# Patient Record
Sex: Male | Born: 1990 | Race: White | Hispanic: No | Marital: Married | Smoking: Never smoker
Health system: Southern US, Community
[De-identification: ages and names within clinical notes are randomized; demographics above are authoritative.]

## PROBLEM LIST (undated history)

## (undated) DIAGNOSIS — L301 Dyshidrosis [pompholyx]: Secondary | ICD-10-CM

## (undated) HISTORY — DX: Dyshidrosis (pompholyx): L30.1

## (undated) HISTORY — PX: WISDOM TOOTH EXTRACTION: SHX21

---

## 2015-08-06 ENCOUNTER — Ambulatory Visit (INDEPENDENT_AMBULATORY_CARE_PROVIDER_SITE_OTHER): Payer: 59 | Admitting: Primary Care

## 2015-08-06 ENCOUNTER — Encounter: Payer: Self-pay | Admitting: Primary Care

## 2015-08-06 VITALS — BP 122/82 | HR 81 | Temp 98.2°F | Ht 68.5 in | Wt 157.8 lb

## 2015-08-06 DIAGNOSIS — Z23 Encounter for immunization: Secondary | ICD-10-CM | POA: Diagnosis not present

## 2015-08-06 DIAGNOSIS — Z0000 Encounter for general adult medical examination without abnormal findings: Secondary | ICD-10-CM | POA: Diagnosis not present

## 2015-08-06 LAB — COMPREHENSIVE METABOLIC PANEL
ALK PHOS: 35 U/L — AB (ref 39–117)
ALT: 17 U/L (ref 0–53)
AST: 20 U/L (ref 0–37)
Albumin: 4.5 g/dL (ref 3.5–5.2)
BUN: 13 mg/dL (ref 6–23)
CO2: 30 meq/L (ref 19–32)
Calcium: 9.7 mg/dL (ref 8.4–10.5)
Chloride: 103 mEq/L (ref 96–112)
Creatinine, Ser: 1 mg/dL (ref 0.40–1.50)
GFR: 96.34 mL/min (ref >60.00)
GLUCOSE: 102 mg/dL — AB (ref 70–99)
POTASSIUM: 4.2 meq/L (ref 3.5–5.1)
Sodium: 138 mEq/L (ref 135–145)
TOTAL PROTEIN: 7.3 g/dL (ref 6.0–8.3)
Total Bilirubin: 0.4 mg/dL (ref 0.2–1.2)

## 2015-08-06 LAB — LIPID PANEL
CHOLESTEROL: 195 mg/dL (ref 0–200)
HDL: 53.9 mg/dL (ref >39.00)
LDL CALC: 110 mg/dL — AB (ref 0–99)
NonHDL: 141.17
TRIGLYCERIDES: 155 mg/dL — AB (ref 0.0–149.0)
Total CHOL/HDL Ratio: 4
VLDL: 31 mg/dL (ref 0.0–40.0)

## 2015-08-06 NOTE — Patient Instructions (Signed)
You've been provided with a tetanus vaccination which will cover you for 10 years.  Continue your healthy lifestyle.   Ensure you are drinking 64 ounces of water daily.  Increase your exercise regimen. You should be getting 1 hour of moderate intensity exercise 5 days weekly.  Schedule a dental examination.  Complete lab work prior to leaving today. I will notify you of your results once received.   Follow up in 1 year for repeat physical or sooner if needed.  It was a pleasure to meet you today! Please don't hesitate to call me with any questions. Welcome to [facility]!

## 2015-08-06 NOTE — Assessment & Plan Note (Signed)
Tdap due and provided today. Leads an active and healthy lifestyle. Discussed to increase consumption of water and exercise. Exam unremarkable. Labs pending. Follow up in 1 year for repeat physical.

## 2015-08-06 NOTE — Progress Notes (Signed)
Pre visit review using our clinic review tool, if applicable. No additional management support is needed unless otherwise documented below in the visit note. 

## 2015-08-06 NOTE — Progress Notes (Signed)
Subjective:    Patient ID: Oscar Armstrong, male    DOB: [birth year], 25 y.o.   MRN: [id number]  HPI  Oscar Armstrong is a 25 year old male who presents today to establish care and for complete physical.  Immunizations: -Tetanus: Unsure, [facility] reports 2003. -Influenza: Received last season.   Diet: Endorses a healthy diet. 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                    Review of Systems  Constitutional: Negative for unexpected weight change.  HENT: Negative for rhinorrhea.   Respiratory: Negative for shortness of breath.   Cardiovascular: Negative for chest pain.  Gastrointestinal: Negative for diarrhea and constipation.  Genitourinary: Negative for difficulty urinating.  Musculoskeletal: Negative for myalgias and arthralgias.  Skin: Negative for rash.  Allergic/Immunologic: Positive for environmental allergies.  Neurological: Negative for dizziness, numbness and headaches.  Psychiatric/Behavioral:       Denies concerns for anxiety or depression       No past medical history on file.   Social History   Social History  . Marital Status: Married    Spouse Name: N/A  . Number of Children: N/A  . Years of Education: N/A   Occupational History  . Not on file.   Social History Main Topics  . Smoking status: Never Smoker   . Smokeless tobacco: Not on file  . Alcohol Use: No  . Drug Use: No  . Sexual Activity: Not on file   Other Topics Concern  . Not on file   Social History Narrative   Married.   1 son.   Works as a Community education officer.   Enjoys Presenter, broadcasting games, spending time with family, church.           Past  Surgical History  Procedure Laterality Date  . Wisdom tooth extraction      Family History  Problem Relation Age of Onset  . Arthritis Mother   . Hyperlipidemia Mother   . Hyperlipidemia Maternal Aunt   . Hyperlipidemia Maternal Grandmother   . Hypertension Mother   . Kennedy's disease Maternal Grandfather     No Known Allergies  No current outpatient prescriptions on file prior to visit.   No current facility-administered medications on file prior to visit.    BP 122/82 mmHg  Pulse 81  Temp(Src) 98.2 F (36.8 C) (Oral)  Ht 5' 8.5" (1.74 m)  Wt 157 lb 12.8 oz (71.578 kg)  BMI 23.64 kg/m2  SpO2 98%    Objective:   Physical Exam  Constitutional: He is oriented to person, place, and time. He appears well-nourished.  HENT:  Right Ear: Tympanic membrane and ear canal normal.  Left Ear: Tympanic membrane and ear canal normal.  Nose: Nose normal. Right sinus exhibits no maxillary sinus tenderness and no frontal sinus tenderness. Left sinus exhibits no maxillary sinus tenderness and no frontal sinus tenderness.  Mouth/Throat: Oropharynx is clear and moist.  Eyes: Conjunctivae and EOM are normal. Pupils are equal, round, and reactive to light.  Neck: Neck supple. Carotid bruit is not present. No thyromegaly present.  Cardiovascular: Normal rate, regular rhythm and normal heart sounds.   Pulmonary/Chest: Effort normal and breath  sounds normal. He has no wheezes. He has no rales.  Abdominal: Soft. Bowel sounds are normal. There is no tenderness.  Musculoskeletal: Normal range of motion.  Neurological: He is alert and oriented to person, place, and time. He has normal reflexes. No cranial nerve deficit.  Skin: Skin is warm and dry.  Psychiatric: He has a normal mood and affect.          Assessment & Plan:

## 2015-08-07 ENCOUNTER — Encounter: Payer: Self-pay | Admitting: *Deleted

## 2016-08-05 DIAGNOSIS — H52223 Regular astigmatism, bilateral: Secondary | ICD-10-CM | POA: Diagnosis not present

## 2016-08-05 DIAGNOSIS — H5213 Myopia, bilateral: Secondary | ICD-10-CM | POA: Diagnosis not present

## 2016-08-05 DIAGNOSIS — H50812 Duane's syndrome, left eye: Secondary | ICD-10-CM | POA: Diagnosis not present

## 2016-08-11 ENCOUNTER — Ambulatory Visit (INDEPENDENT_AMBULATORY_CARE_PROVIDER_SITE_OTHER): Payer: 59 | Admitting: Primary Care

## 2016-08-11 ENCOUNTER — Ambulatory Visit (INDEPENDENT_AMBULATORY_CARE_PROVIDER_SITE_OTHER)
Admission: RE | Admit: 2016-08-11 | Discharge: 2016-08-11 | Disposition: A | Discharge status: Home / Self Care | Payer: 59 | Source: Ambulatory Visit | Attending: Primary Care | Admitting: Primary Care

## 2016-08-11 ENCOUNTER — Telehealth: Payer: Self-pay

## 2016-08-11 ENCOUNTER — Encounter: Payer: Self-pay | Admitting: Primary Care

## 2016-08-11 VITALS — BP 122/78 | HR 73 | Temp 98.3°F | Ht 68.5 in | Wt 161.0 lb

## 2016-08-11 DIAGNOSIS — M79671 Pain in right foot: Secondary | ICD-10-CM

## 2016-08-11 DIAGNOSIS — Z0001 Encounter for general adult medical examination with abnormal findings: Secondary | ICD-10-CM | POA: Diagnosis not present

## 2016-08-11 DIAGNOSIS — S99921A Unspecified injury of right foot, initial encounter: Secondary | ICD-10-CM | POA: Diagnosis not present

## 2016-08-11 DIAGNOSIS — Z0000 Encounter for general adult medical examination without abnormal findings: Secondary | ICD-10-CM

## 2016-08-11 NOTE — Assessment & Plan Note (Signed)
Immunizations UTD. Commended him on his healthy lifestyle through diet, discussed to start exercising. Exam unremarkable. Labs pending. Follow up in 1 year for annual exam.

## 2016-08-11 NOTE — Telephone Encounter (Signed)
Do we have these in the clinic? If so, can you provide him with one? If not, then let me know.

## 2016-08-11 NOTE — Progress Notes (Signed)
Subjective:    Patient ID: Oscar Armstrong, male    DOB: 1990/11/01, 26 y.o.   MRN: [id number]  HPI  Oscar Armstrong is a 26 year old male who presents today for complete physical.  Immunizations: -Tetanus: Completed in 2017 -Influenza: Completed last season   Diet: He endorses a healthy diet. Breakfast: Coffee, waffles, fruit Lunch: Meat, vegetables Dinner: Vegetables Snacks: Crackers with peanut butter Desserts: 5 days weekly Beverages: Coffee, water, orange juice  Exercise: He does not currently exercise, but is active  Eye exam: Completed in 2018 Dental exam: Completed 2-3 years ago   Review of Systems  Constitutional: Negative for unexpected weight change.  HENT: Negative for rhinorrhea.   Respiratory: Negative for cough and shortness of breath.   Cardiovascular: Negative for chest pain.  Gastrointestinal: Negative for constipation and diarrhea.  Genitourinary: Negative for difficulty urinating.  Musculoskeletal: Negative for arthralgias and myalgias.       5th metatarsal pain to right foot since tripping   Skin: Negative for rash.  Allergic/Immunologic: Negative for environmental allergies.  Neurological: Negative for dizziness, numbness and headaches.  Psychiatric/Behavioral:       He denies concerns for anxiety or depression       No past medical history on file.   Social History   Social History  . Marital status: Married    Spouse name: N/A  . Number of children: N/A  . Years of education: N/A   Occupational History  . Not on file.   Social History Main Topics  . Smoking status: Never Smoker  . Smokeless tobacco: Never Used  . Alcohol use No  . Drug use: No  . Sexual activity: Not on file   Other Topics Concern  . Not on file   Social History Narrative   Married.   1 son.   Works as a Community education officer.   Enjoys Presenter, broadcasting games, spending time with family, church.           Past Surgical History:  Procedure Laterality Date  .  WISDOM TOOTH EXTRACTION      Family History  Problem Relation Age of Onset  . Arthritis Mother   . Hyperlipidemia Mother   . Hyperlipidemia Maternal Aunt   . Hyperlipidemia Maternal Grandmother   . Hypertension Mother   . Kennedy's disease Maternal Grandfather     No Known Allergies  No current outpatient prescriptions on file prior to visit.   No current facility-administered medications on file prior to visit.     BP 122/78   Pulse 73   Temp 98.3 F (36.8 C) (Oral)   Ht 5' 8.5" (1.74 m)   Wt 161 lb (73 kg)   SpO2 98%   BMI 24.12 kg/m    Objective:   Physical Exam  Constitutional: He is oriented to person, place, and time. He appears well-nourished.  HENT:  Right Ear: Tympanic membrane and ear canal normal.  Left Ear: Tympanic membrane and ear canal normal.  Nose: Nose normal. Right sinus exhibits no maxillary sinus tenderness and no frontal sinus tenderness. Left sinus exhibits no maxillary sinus tenderness and no frontal sinus tenderness.  Mouth/Throat: Oropharynx is clear and moist.  Eyes: Conjunctivae and EOM are normal. Pupils are equal, round, and reactive to light.  Neck: Neck supple. Carotid bruit is not present. No thyromegaly present.  Cardiovascular: Normal rate, regular rhythm and normal heart sounds.   Pulses:      Dorsalis pedis pulses are 2+ on the right side, and  2+ on the left side.       Posterior tibial pulses are 2+ on the right side, and 2+ on the left side.  Pulmonary/Chest: Effort normal and breath sounds normal. He has no wheezes. He has no rales.  Abdominal: Soft. Bowel sounds are normal. There is no tenderness.  Musculoskeletal: Normal range of motion.       Right foot: There is normal range of motion, no tenderness, no bony tenderness and no swelling.  Neurological: He is alert and oriented to person, place, and time. He has normal reflexes. No cranial nerve deficit.  Skin: Skin is warm and dry.  Mild bruising to right anterior MCP joints  3-5.   Psychiatric: He has a normal mood and affect.          Assessment & Plan:  Foot Pain:  Located to right 3-5 MCP joints. Mild bruising. Good ROM. No erythema or swelling. Based off of pics from initial injury, it's very likely he may have fracture. Xray pending today. Discussed home care instructions.  Sheral Flow, NP

## 2016-08-11 NOTE — Patient Instructions (Signed)
Complete xray(s) prior to leaving today. I will notify you of your results once received.  Continue to work on a healthy lifestyle through diet.  Start exercising. You should be getting 150 minutes of moderate intensity exercise weekly.  Ensure you are drinking 64 ounces of water daily.  Follow up in 1 year for your annual exam or sooner if needed.  It was a pleasure to see you today!

## 2016-08-11 NOTE — Progress Notes (Signed)
Pre visit review using our clinic review tool, if applicable. No additional management support is needed unless otherwise documented below in the visit note. 

## 2016-08-11 NOTE — Telephone Encounter (Signed)
Pt left v/m; pt received the mychart note and pt wants to know where to get a post op shoe and does pt need rx to get shoe. Pt request cb.

## 2016-08-12 NOTE — Telephone Encounter (Signed)
Yes, we do have post op shoe. There is a $15 upfront charge that patient would have to pay.

## 2016-08-12 NOTE — Telephone Encounter (Signed)
Please call patient and notify him of the charge. If he doesn't want to try that and he could try wearing more firm shoes such as [place] or work type boot/shoes. He needs something that will provide stability.

## 2016-08-13 NOTE — Telephone Encounter (Signed)
Message left for patient to return my call.  Also sent patient a message through MyChart 

## 2016-08-15 NOTE — Telephone Encounter (Signed)
Noted. Patient viewed messaged in [place].

## 2016-11-22 DIAGNOSIS — J0100 Acute maxillary sinusitis, unspecified: Secondary | ICD-10-CM | POA: Diagnosis not present

## 2017-02-14 ENCOUNTER — Telehealth: Payer: 59 | Admitting: Physician Assistant

## 2017-02-14 DIAGNOSIS — R21 Rash and other nonspecific skin eruption: Secondary | ICD-10-CM

## 2017-02-14 NOTE — Progress Notes (Signed)
Based on what you shared with me it looks like you have a condition that should be evaluated in a face to face office visit. The rash seems very flaky/scaly from the picture which raises concern for psoriatic rash or other atypical rash. Really need an in person exam to further assess this. NOTE: Even if you have entered your credit card information for this eVisit, you will not be charged.   If you are having a true medical emergency please call 911.  If you need an urgent face to face visit, [facility] has four urgent care centers for your convenience.  If you need care fast and have a high deductible or no insurance consider:   [email or web address]  [phone]  [address], [address] [place], [place] [address] 8 am to 8 pm Monday-Friday 10 am to 4 pm Saturday-Sunday   The following sites will take your  insurance:    . [facility]gent [place] a Provider at this Location  [address] [place], [place] [address] . 10 am to 8 pm Monday-Friday . 12 pm to 8 pm Saturday-Sunday   . [facility] at [place] a Provider at this Location  [address] [place], [place], [place] [address] . 8 am to 8 pm Monday-Friday . 9 am to 6 pm Saturday . 11 am to 6 pm Sunday   . [facility] at [place] Get Driving Directions  [address] [place],  [place] [address] . 8 am to 8 pm Monday-Friday . 8 am to 4 pm Saturday-Sunday   Your e-visit answers were reviewed by a board certified advanced clinical practitioner to complete your personal care plan.  Thank you for using e-Visits.

## 2017-02-26 MED FILL — FLUOCINONIDE 0.05% CREAM: 0.05 | 10 days supply | Qty: 30 | Fill #0

## 2017-09-24 ENCOUNTER — Other Ambulatory Visit: Payer: Self-pay | Admitting: Primary Care

## 2017-09-24 DIAGNOSIS — Z0000 Encounter for general adult medical examination without abnormal findings: Secondary | ICD-10-CM

## 2017-09-29 ENCOUNTER — Other Ambulatory Visit (INDEPENDENT_AMBULATORY_CARE_PROVIDER_SITE_OTHER): Payer: 59

## 2017-09-29 DIAGNOSIS — Z0000 Encounter for general adult medical examination without abnormal findings: Secondary | ICD-10-CM | POA: Diagnosis not present

## 2017-09-29 LAB — COMPREHENSIVE METABOLIC PANEL
ALBUMIN: 4.7 g/dL (ref 3.5–5.2)
ALK PHOS: 35 U/L — AB (ref 39–117)
ALT: 26 U/L (ref 0–53)
AST: 27 U/L (ref 0–37)
BUN: 14 mg/dL (ref 6–23)
CALCIUM: 9.9 mg/dL (ref 8.4–10.5)
CO2: 30 mEq/L (ref 19–32)
Chloride: 103 mEq/L (ref 96–112)
Creatinine, Ser: 1.08 mg/dL (ref 0.40–1.50)
GFR: 86.72 mL/min (ref >60.00)
GLUCOSE: 98 mg/dL (ref 70–99)
POTASSIUM: 4.4 meq/L (ref 3.5–5.1)
Sodium: 140 mEq/L (ref 135–145)
TOTAL PROTEIN: 7.5 g/dL (ref 6.0–8.3)
Total Bilirubin: 0.6 mg/dL (ref 0.2–1.2)

## 2017-09-29 LAB — LIPID PANEL
CHOLESTEROL: 201 mg/dL — AB (ref 0–200)
HDL: 59.4 mg/dL (ref >39.00)
LDL Cholesterol: 124 mg/dL — ABNORMAL HIGH (ref 0–99)
NONHDL: 141.82
TRIGLYCERIDES: 90 mg/dL (ref 0.0–149.0)
Total CHOL/HDL Ratio: 3
VLDL: 18 mg/dL (ref 0.0–40.0)

## 2017-10-06 ENCOUNTER — Encounter: Payer: Self-pay | Admitting: Primary Care

## 2017-10-06 ENCOUNTER — Ambulatory Visit (INDEPENDENT_AMBULATORY_CARE_PROVIDER_SITE_OTHER): Payer: 59 | Admitting: Primary Care

## 2017-10-06 VITALS — BP 116/72 | HR 63 | Temp 98.1°F | Ht 68.5 in | Wt 158.0 lb

## 2017-10-06 DIAGNOSIS — Z0000 Encounter for general adult medical examination without abnormal findings: Secondary | ICD-10-CM

## 2017-10-06 NOTE — Progress Notes (Signed)
Subjective:    Patient ID: Oscar Armstrong, male    DOB: Sep 15, 1990, 27 y.o.   MRN: [id number]  HPI  Oscar Armstrong is a 27 year old male who presents today for complete physical.  Immunizations: -Tetanus: Completed in 2017 -Influenza: Completed last season  Diet: he endorses a healthy diet Breakfast: Bagel or toast with cream cheese/peanut butter/jelly Lunch: Veggies, protein  Dinner: Vegetables  Snacks: Graham crackers and peanut butter Desserts: Cobbler, 3 days weekly  Beverages: Coffee, water, juice  Exercise: He is not regularly exercising, active at work Eye exam: Completed in 2018 Dental exam: Completes semi-annually   Review of Systems  Constitutional: Negative for unexpected weight change.  HENT: Negative for rhinorrhea.   Respiratory: Negative for cough and shortness of breath.   Cardiovascular: Negative for chest pain.  Gastrointestinal: Negative for constipation and diarrhea.  Genitourinary: Negative for difficulty urinating.  Musculoskeletal: Negative for arthralgias and myalgias.  Skin: Negative for rash.  Allergic/Immunologic: Negative for environmental allergies.  Neurological: Negative for dizziness, numbness and headaches.  Psychiatric/Behavioral: The patient is not nervous/anxious.        No past medical history on file.   Social History   Socioeconomic History  . Marital status: Married    Spouse name: Not on file  . Number of children: Not on file  . Years of education: Not on file  . Highest education level: Not on file  Occupational History  . Not on file  Social Needs  . Financial resource strain: Not on file  . Food insecurity:    Worry: Not on file    Inability: Not on file  . Transportation needs:    Medical: Not on file    Non-medical: Not on file  Tobacco Use  . Smoking status: Never Smoker  . Smokeless tobacco: Never Used  Substance and Sexual Activity  . Alcohol use: No    Alcohol/week: 0.0 oz  . Drug use: No  . Sexual  activity: Not on file  Lifestyle  . Physical activity:    Days per week: Not on file    Minutes per session: Not on file  . Stress: Not on file  Relationships  . Social connections:    Talks on phone: Not on file    Gets together: Not on file    Attends religious service: Not on file    Active member of club or organization: Not on file    Attends meetings of clubs or organizations: Not on file    Relationship status: Not on file  . Intimate partner violence:    Fear of current or ex partner: Not on file    Emotionally abused: Not on file    Physically abused: Not on file    Forced sexual activity: Not on file  Other Topics Concern  . Not on file  Social History Narrative   Married.   1 son.   Works as a Community education officer.   Enjoys Presenter, broadcasting games, spending time with family, church.        Family History  Problem Relation Age of Onset  . Arthritis Mother   . Hyperlipidemia Mother   . Hyperlipidemia Maternal Aunt   . Hyperlipidemia Maternal Grandmother   . Hypertension Mother   . Kennedy's disease Maternal Grandfather     No Known Allergies  No current outpatient medications on file prior to visit.   No current facility-administered medications on file prior to visit.     BP 116/72  Pulse 63   Temp 98.1 F (36.7 C) (Oral)   Ht 5' 8.5" (1.74 m)   Wt 158 lb (71.7 kg)   SpO2 98%   BMI 23.67 kg/m    Objective:   Physical Exam  Constitutional: He is oriented to person, place, and time. He appears well-nourished.  HENT:  Mouth/Throat: No oropharyngeal exudate.  Eyes: Pupils are equal, round, and reactive to light. EOM are normal.  Neck: Neck supple. No thyromegaly present.  Cardiovascular: Normal rate and regular rhythm.  Respiratory: Effort normal and breath sounds normal.  GI: Soft. Bowel sounds are normal. There is no tenderness.  Musculoskeletal: Normal range of motion.  Neurological: He is alert and oriented to person, place, and time.    Skin: Skin is warm and dry.  Psychiatric: He has a normal mood and affect.           Assessment & Plan:

## 2017-10-06 NOTE — Assessment & Plan Note (Signed)
Td UTD. Recommended regular exercise, continue a healthy diet. Exam unremarkable. Labs unremarkable. Follow up in 1 year for CPE.

## 2017-10-06 NOTE — Patient Instructions (Signed)
Start exercising. You should be getting 150 minutes of moderate intensity exercise weekly.  Continue to eat a healthy diet.   Ensure you are consuming 64 ounces of water daily.  Follow up in 1 year for your annual exam or sooner if needed.  It was a pleasure to see you today!   Preventive Care 18-39 Years, Male Preventive care refers to lifestyle choices and visits with your health care provider that can promote health and wellness. What does preventive care include?  A yearly physical exam. This is also called an annual well check.  Dental exams once or twice a year.  Routine eye exams. Ask your health care provider how often you should have your eyes checked.  Personal lifestyle choices, including: ? Daily care of your teeth and gums. ? Regular physical activity. ? Eating a healthy diet. ? Avoiding tobacco and drug use. ? Limiting alcohol use. ? Practicing safe sex. What happens during an annual well check? The services and screenings done by your health care provider during your annual well check will depend on your age, overall health, lifestyle risk factors, and family history of disease. Counseling Your health care provider may ask you questions about your:  Alcohol use.  Tobacco use.  Drug use.  Emotional well-being.  Home and relationship well-being.  Sexual activity.  Eating habits.  Work and work Statistician.  Screening You may have the following tests or measurements:  Height, weight, and BMI.  Blood pressure.  Lipid and cholesterol levels. These may be checked every 5 years starting at age 44.  Diabetes screening. This is done by checking your blood sugar (glucose) after you have not eaten for a while (fasting).  Skin check.  Hepatitis C blood test.  Hepatitis B blood test.  Sexually transmitted disease (STD) testing.  Discuss your test results, treatment options, and if necessary, the need for more tests with your health care  provider. Vaccines Your health care provider may recommend certain vaccines, such as:  Influenza vaccine. This is recommended every year.  Tetanus, diphtheria, and acellular pertussis (Tdap, Td) vaccine. You may need a Td booster every 10 years.  Varicella vaccine. You may need this if you have not been vaccinated.  HPV vaccine. If you are 20 or younger, you may need three doses over 6 months.  Measles, mumps, and rubella (MMR) vaccine. You may need at least one dose of MMR.You may also need a second dose.  Pneumococcal 13-valent conjugate (PCV13) vaccine. You may need this if you have certain conditions and have not been vaccinated.  Pneumococcal polysaccharide (PPSV23) vaccine. You may need one or two doses if you smoke cigarettes or if you have certain conditions.  Meningococcal vaccine. One dose is recommended if you are age 52-21 years and a first-year college student living in a residence hall, or if you have one of several medical conditions. You may also need additional booster doses.  Hepatitis A vaccine. You may need this if you have certain conditions or if you travel or work in places where you may be exposed to hepatitis A.  Hepatitis B vaccine. You may need this if you have certain conditions or if you travel or work in places where you may be exposed to hepatitis B.  Haemophilus influenzae type b (Hib) vaccine. You may need this if you have certain risk factors.  Talk to your health care provider about which screenings and vaccines you need and how often you need them. This information is not intended  to replace advice given to you by your health care provider. Make sure you discuss any questions you have with your health care provider. Document Released: 05/27/2001 Document Revised: 12/19/2015 Document Reviewed: 01/30/2015 Elsevier Interactive Patient Education  [employer].

## 2018-03-19 ENCOUNTER — Telehealth: Payer: 59 | Admitting: Family

## 2018-03-19 DIAGNOSIS — B369 Superficial mycosis, unspecified: Secondary | ICD-10-CM

## 2018-03-19 MED ORDER — KETOCONAZOLE 2 % EX CREA: 1.0000 "application " | TOPICAL_CREAM | Freq: Every day | CUTANEOUS | 0 refills | Status: DC

## 2018-03-19 NOTE — Progress Notes (Signed)
Thank you for the details you included in the comment boxes. Those details are very helpful in determining the best course of treatment for you and help [place] to provide the best care. The photo of your thumb initially looks like a possible fungal infection. Fungal infections can be persistent and tough to get rid of. The photos of the back of your hand, with the redness, is more in line with a viral or bacterial rash. Rashes are always difficult, which is why dermatologists are so busy. You indicate you improved after using a topical steroid. You may use over-the-counter hydrocortisone cream 1% for this. However, let's try a different approach. As it may be fungal, I am sending in a prescription for an anti-fungal cream. Give it 5-7 days and see how it does. The steroid cream may have decreased the swelling in the past, but not actually treated the cause. If you do not see benefit from the antifungal cream in 5-7 days, you may opt to use the 1% over the counter hydrocortisone cream, or perhaps see a dermatologist as they are truly the experts in this areas.  E Visit for Rash  We are sorry that you are not feeling well. Here is how we plan to help!    Based upon your presentation it appears you have a fungal infection.  I have prescribed: Ketoconazole cream to be applied to the area once daily.    HOME CARE:   Take cool showers and avoid direct sunlight.  Apply cool compress or wet dressings.  Take a bath in an oatmeal bath.  Sprinkle content of one Aveeno packet under running faucet with comfortably warm water.  Bathe for 15-20 minutes, 1-2 times daily.  Pat dry with a towel. Do not rub the rash.  Use hydrocortisone cream.  Take an antihistamine like Benadryl for widespread rashes that itch.  The adult dose of Benadryl is 25-50 mg by mouth 4 times daily.  Caution:  This type of medication may cause sleepiness.  Do not drink alcohol, drive, or operate dangerous machinery while taking  antihistamines.  Do not take these medications if you have prostate enlargement.  Read package instructions thoroughly on all medications that you take.  GET HELP RIGHT AWAY IF:   Symptoms don't go away after treatment.  Severe itching that persists.  If you rash spreads or swells.  If you rash begins to smell.  If it blisters and opens or develops a yellow-brown crust.  You develop a fever.  You have a sore throat.  You become short of breath.  MAKE SURE YOU:  Understand these instructions. Will watch your condition. Will get help right away if you are not doing well or get worse.  Thank you for choosing an e-visit. Your e-visit answers were reviewed by a board certified advanced clinical practitioner to complete your personal care plan. Depending upon the condition, your plan could have included both over the counter or prescription medications. Please review your pharmacy choice. Be sure that the pharmacy you have chosen is open so that you can pick up your prescription now.  If there is a problem you may message your provider in [place] to have the prescription routed to another pharmacy. Your safety is important to [place]. If you have drug allergies check your prescription carefully.  For the next 24 hours, you can use MyChart to ask questions about today's visit, request a non-urgent call back, or ask for a work or school excuse from your e-visit provider.  You will get an email in the next two days asking about your experience. I hope that your e-visit has been valuable and will speed your recovery.

## 2018-03-29 DIAGNOSIS — H52223 Regular astigmatism, bilateral: Secondary | ICD-10-CM | POA: Diagnosis not present

## 2018-03-29 DIAGNOSIS — H50812 Duane's syndrome, left eye: Secondary | ICD-10-CM | POA: Diagnosis not present

## 2018-03-29 DIAGNOSIS — H5213 Myopia, bilateral: Secondary | ICD-10-CM | POA: Diagnosis not present

## 2018-03-30 ENCOUNTER — Encounter: Payer: Self-pay | Admitting: Primary Care

## 2018-03-30 ENCOUNTER — Ambulatory Visit: Payer: 59 | Admitting: Primary Care

## 2018-03-30 VITALS — BP 118/82 | HR 72 | Temp 98.2°F | Ht 68.5 in | Wt 155.5 lb

## 2018-03-30 DIAGNOSIS — L301 Dyshidrosis [pompholyx]: Secondary | ICD-10-CM | POA: Diagnosis not present

## 2018-03-30 MED ORDER — TRIAMCINOLONE ACETONIDE 0.5 % EX OINT: 1.0000 "application " | TOPICAL_OINTMENT | Freq: Two times a day (BID) | CUTANEOUS | 0 refills | Status: DC

## 2018-03-30 MED ORDER — PREDNISONE 20 MG PO TABS: ORAL_TABLET | ORAL | 0 refills | Status: DC

## 2018-03-30 NOTE — Patient Instructions (Signed)
Start oral prednisone 20 mg. Take 3 tablets for three days, then 2 tablets for three days, then 1 tablet for three days.  You may use the triamcinolone ointment twice daily x 1 week at a time as needed.  Please update me if your rash occurs more frequently despite treatment.  It was a pleasure to see you today!   Dyshidrotic Eczema Dyshidrotic eczema (pompholyx) is a type of eczema that causes very itchy (pruritic), fluid-filled blisters (vesicles) to form on the hands and feet. It can affect people of any age, but is more common before the age of 23. There is no cure, but treatment and certain lifestyle changes can help relieve symptoms. What are the causes? The cause of this condition is not known. What increases the risk? You are more likely to develop this condition if:  You wash your hands frequently.  You have a personal history or family history of eczema, allergies, asthma, or hay fever.  You are allergic to metals such as nickel or cobalt.  You work with cement.  You smoke.  What are the signs or symptoms? Symptoms of this condition may affect the hands, feet, or both. Symptoms may come and go (recur), and may include:  Severe itching, which may happen before blisters appear.  Blisters. These may form suddenly. ? In the early stages, blisters may form near the fingertips. ? In severe cases, blisters may grow to large blister masses (bullae). ? Blisters resolve in 2-3 weeks without bursting. This is followed by a dry phase in which itching eases.  Pain and swelling.  Cracks or long, narrow openings (fissures) in the skin.  Severe dryness.  Ridges on the nails.  How is this diagnosed? This condition may be diagnosed based on:  A physical exam.  Your symptoms.  Your medical history.  Skin scrapings to rule out a fungal infection.  Testing a swab of fluid for bacteria (culture).  Removing and checking a small piece of skin (biopsy) in order to test for  infection or to rule out other conditions.  Skin patch tests. These tests involve taking patches that contain possible allergens and placing them on your back. Your health care provider will wait a few days and then check to see if an allergic reaction occurred. These tests may be done if your health care provider suspects allergic reactions, or to rule out other types of eczema.  You may be referred to a health care provider who specializes in the skin (dermatologist) to help diagnose and treat this condition. How is this treated? There is no cure for this condition, but treatment can help relieve symptoms. Depending on how many blisters you have and how severe they are, your health care provider may suggest:  Avoiding allergens, irritants, or triggers that worsen symptoms. This may involve lifestyle changes such as: ? Using different lotions or soaps. ? Avoiding hot weather or places that will cause you to sweat a lot. ? Managing stress with coping techniques such as relaxation and exercise, and asking for help when you need it. ? Diet changes as recommended by your health care provider.  Using a clean, damp towel (cool compress) to relieve symptoms.  Soaking in a bath that contains a type of salt that relieves irritation (aluminum acetate soaks).  Medicine taken by mouth to reduce itching (oral antihistamines).  Medicine applied to the skin to reduce swelling and irritation (topical corticosteroids).  Medicine that reduces the activity of the body's disease-fighting system (immunosuppressants) to  treat inflammation. This may be given in severe cases.  Antibiotic medicines to treat bacterial infection.  Light therapy (phototherapy). This involves shining ultraviolet (UV) light on affected skin in order to reduce itchiness and inflammation.  Follow these instructions at home: Bathing and skin care  Wash skin gently. After bathing or washing your hands, pat your skin dry. Avoid rubbing  your skin.  Remove all jewelry before bathing. If the skin under the jewelry stays wet, blisters may form or get worse.  Apply cool compresses as told by your health care provider: ? Soak a clean towel in cool water. ? Wring out excess water until towel is damp. ? Place the towel over affected skin. Leave the towel on for 20 minutes at a time, 2-3 times a day.  Use mild soaps, cleansers, and lotions that do not contain dyes, perfumes, or other irritants.  Keep your skin hydrated. To do this: ? Avoid very hot water. Take lukewarm baths or showers. ? Apply moisturizer within three minutes of bathing. This locks in moisture. Medicines  Take and apply over-the-counter and prescription medicines only as told by your health care provider.  If you were prescribed antibiotic medicine, take or apply it as told by your health care provider. Do not stop using the antibiotic even if you start to feel better. General instructions  Identify and avoid triggers and allergens.  Keep fingernails short to avoid breaking open the skin while scratching.  Use waterproof gloves to protect your hands when doing work that keeps your hands wet for a long time.  Wear socks to keep your feet dry.  Do not use any products that contain nicotine or tobacco, such as cigarettes and e-cigarettes. If you need help quitting, ask your health care provider.  Keep all follow-up visits as told by your health care provider. This is important. Contact a health care provider if:  You have symptoms that do not go away.  You have signs of infection, such as: ? Crusting, pus, or a bad smell. ? More redness, swelling, or pain. ? Increased warmth in the affected area. Summary  Dyshidrotic eczema (pompholyx) is a type of eczema that causes very itchy (pruritic), fluid-filled blisters (vesicles) to form on the hands and feet.  The cause of this condition is not known.  There is no cure for this condition, but treatment  can help relieve symptoms. Treatment depends on how many blisters you have and how severe they are.  Use mild soaps, cleansers, and lotions that do not contain dyes, perfumes, or other irritants. Keep your skin hydrated. This information is not intended to replace advice given to you by your health care provider. Make sure you discuss any questions you have with your health care provider. Document Released: 08/14/2016 Document Revised: 08/14/2016 Document Reviewed: 08/14/2016 Elsevier Interactive Patient Education  2018 [employer].

## 2018-03-30 NOTE — Assessment & Plan Note (Signed)
Intermittent since 2018, no formal diagnosis. No resolve with lower potency topical steroid cream. Today's presentation most certainly warrants systemic steroidal treatment. Rx for prednisone taper sent to pharmacy. Also sent triamcinolone ointment to use BID PRN. He will update if flares continue to reoccur despite treatment.

## 2018-03-30 NOTE — Progress Notes (Signed)
Subjective:    Patient ID: Oscar Armstrong, male    DOB: 06/29/1990, 27 y.o.   MRN: [id number]  HPI  Mr. Knighton is a 27 year old male who presents today with a chief complaint of rash.  His rash is located to the dorsal right hand to the first and second digit, dorsal right hand in between first and second digits, and left dorsal first digit. He has a history of this type of rash that began in October 2018 to the right dorsal hand. He noticed vesicles and crusting, treated with triamcinolone 0.05% cream with resolve. Six to nine months later he'd notice an intermittent return of the rash, he treated with the previously prescribed topical steroid cream with improvement. Two to three weeks ago he noticed return of the rash, didn't have the steroid cream any longer so he treated with several OTC products without improvement.    He was evaluated through an e-visit on 03/19/18, diagnosed with a fungal infection and treated with ketoconazole 2% cream. This caused his symptoms to get worse.   Since his e-visit he's noticed crusting to the vesicles, peeling of his skin. He denies recent trauma. 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   Review of Systems  Constitutional: Negative for fever.  Skin: Positive for color change and rash. Negative for wound.       No past medical history on file.   Social History   Socioeconomic History  . Marital status: Married    Spouse name: Not on file  . Number of children: Not on file  . Years of education: Not on file  . Highest education level: Not on file  Occupational History  . Not on file  Social Needs  . Financial resource strain: Not on file  . Food insecurity:    Worry: Not on file    Inability: Not on file  . Transportation needs:    Medical: Not on file    Non-medical: Not on file  Tobacco Use  . Smoking status: Never Smoker  . Smokeless  tobacco: Never Used  Substance and Sexual Activity  . Alcohol use: No    Alcohol/week: 0.0 standard drinks  . Drug use: No  . Sexual activity: Not on file  Lifestyle  . Physical activity:    Days per week: Not on file    Minutes per session: Not on file  . Stress: Not on file  Relationships  . Social connections:    Talks on phone: Not on file    Gets together: Not on file    Attends religious service: Not on file    Active member of club or organization: Not on file    Attends meetings of clubs or organizations: Not on file    Relationship status: Not on file  . Intimate partner violence:    Fear of current or ex partner: Not on file    Emotionally abused: Not on file    Physically abused: Not on file    Forced sexual activity: Not on file  Other Topics Concern  . Not on file  Social History Narrative   Married.   1 son.   Works as a Community education officer.   Enjoys Presenter, broadcasting games, spending time with family, church.         Family History  Problem Relation Age of Onset  . Arthritis Mother   .  Hyperlipidemia Mother   . Hyperlipidemia Maternal Aunt   . Hyperlipidemia Maternal Grandmother   . Hypertension Mother   . Kennedy's disease Maternal Grandfather     No Known Allergies  Current Outpatient Medications on File Prior to Visit  Medication Sig Dispense Refill  . ketoconazole (NIZORAL) 2 % cream Apply 1 application topically daily. (Patient not taking: Reported on 03/30/2018) 60 g 0   No current facility-administered medications on file prior to visit.     BP 118/82   Pulse 72   Temp 98.2 F (36.8 C) (Oral)   Ht 5' 8.5" (1.74 m)   Wt 155 lb 8 oz (70.5 kg)   SpO2 97%   BMI 23.30 kg/m    Objective:   Physical Exam  Constitutional: He appears well-nourished.  Neck: Neck supple.  Cardiovascular: Normal rate and regular rhythm.  Respiratory: Effort normal and breath sounds normal.  Skin: Skin is warm and dry. Rash noted.  Red, scaly, vesicle  rash to right dorsal hand in between 1st and 2nd digits, to 1st and 2nd digits. Left dorsal 1st digit.           Assessment & Plan:

## 2018-09-15 ENCOUNTER — Other Ambulatory Visit: Payer: Self-pay | Admitting: Primary Care

## 2018-09-15 DIAGNOSIS — Z0000 Encounter for general adult medical examination without abnormal findings: Secondary | ICD-10-CM

## 2018-09-21 ENCOUNTER — Other Ambulatory Visit: Payer: 59

## 2018-09-28 ENCOUNTER — Encounter: Payer: 59 | Admitting: Primary Care

## 2018-10-03 IMAGING — DX DG FOOT COMPLETE 3+V*R*
3 series · 3 of 3 positions shown · non-contrast
Comparison: No recent prior .

CLINICAL DATA: Injury.

EXAM:
RIGHT FOOT COMPLETE - 3+ VIEW

[foot ap]
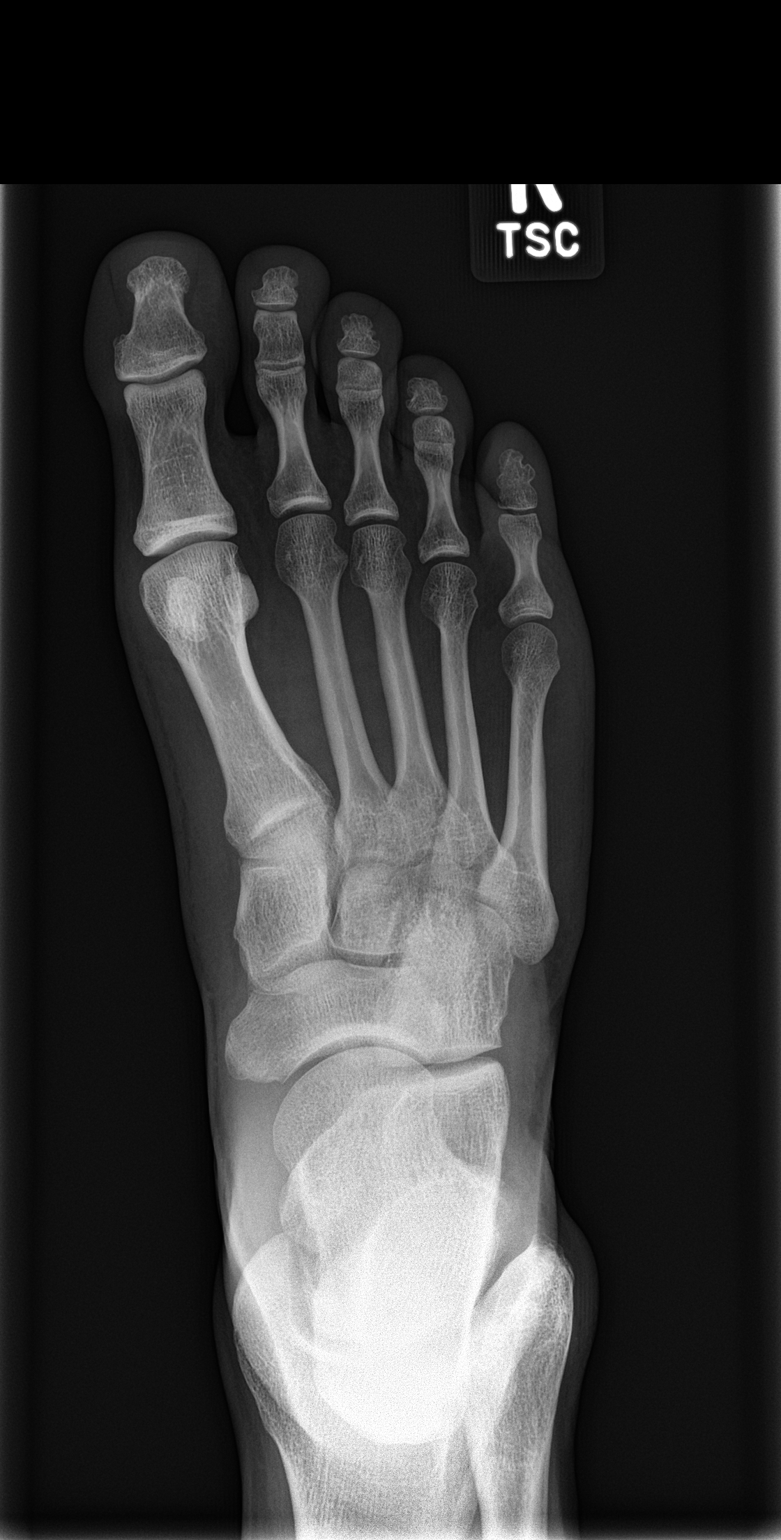

[foot obl]
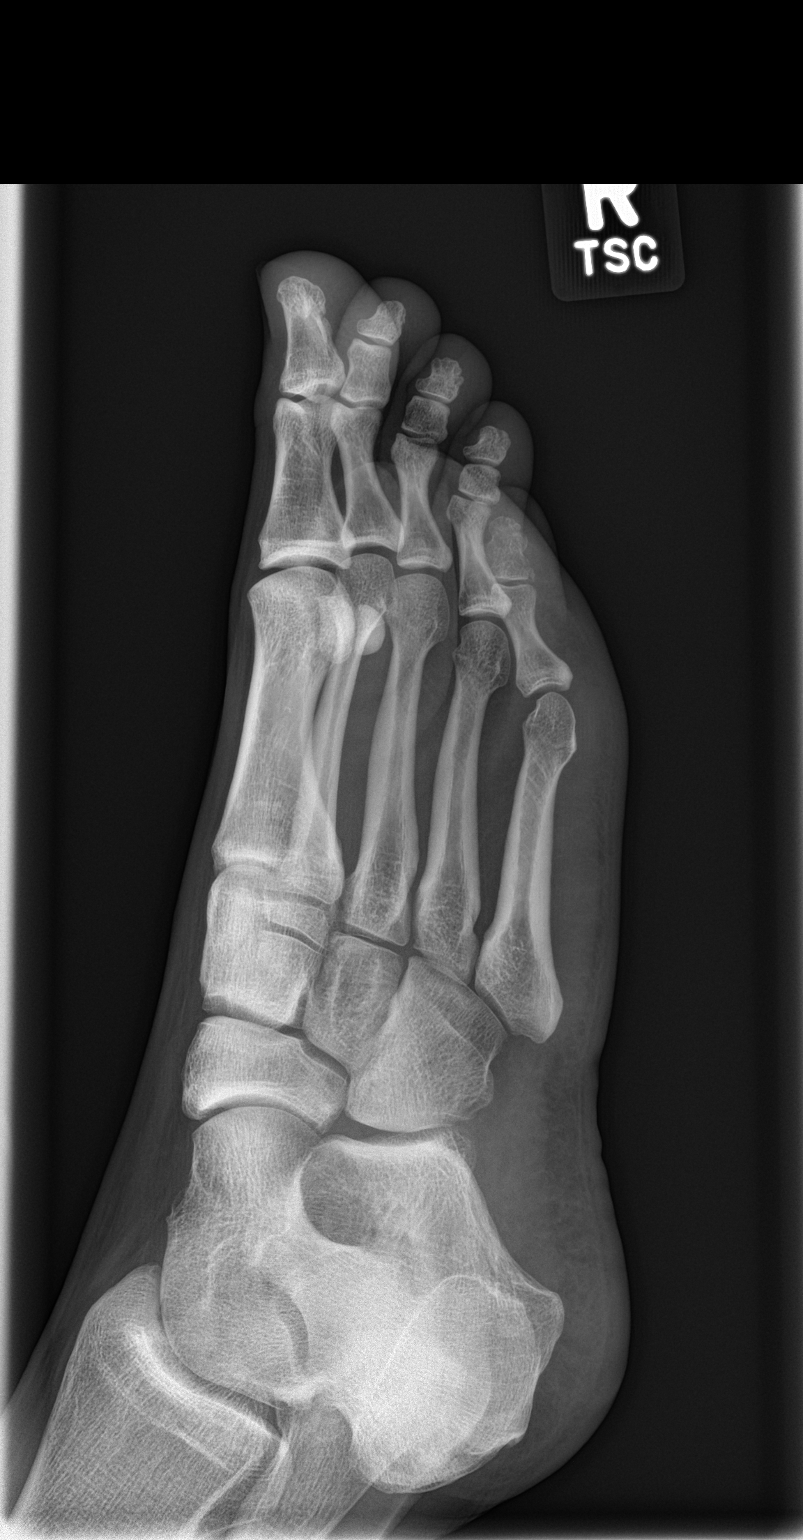

[foot lat]
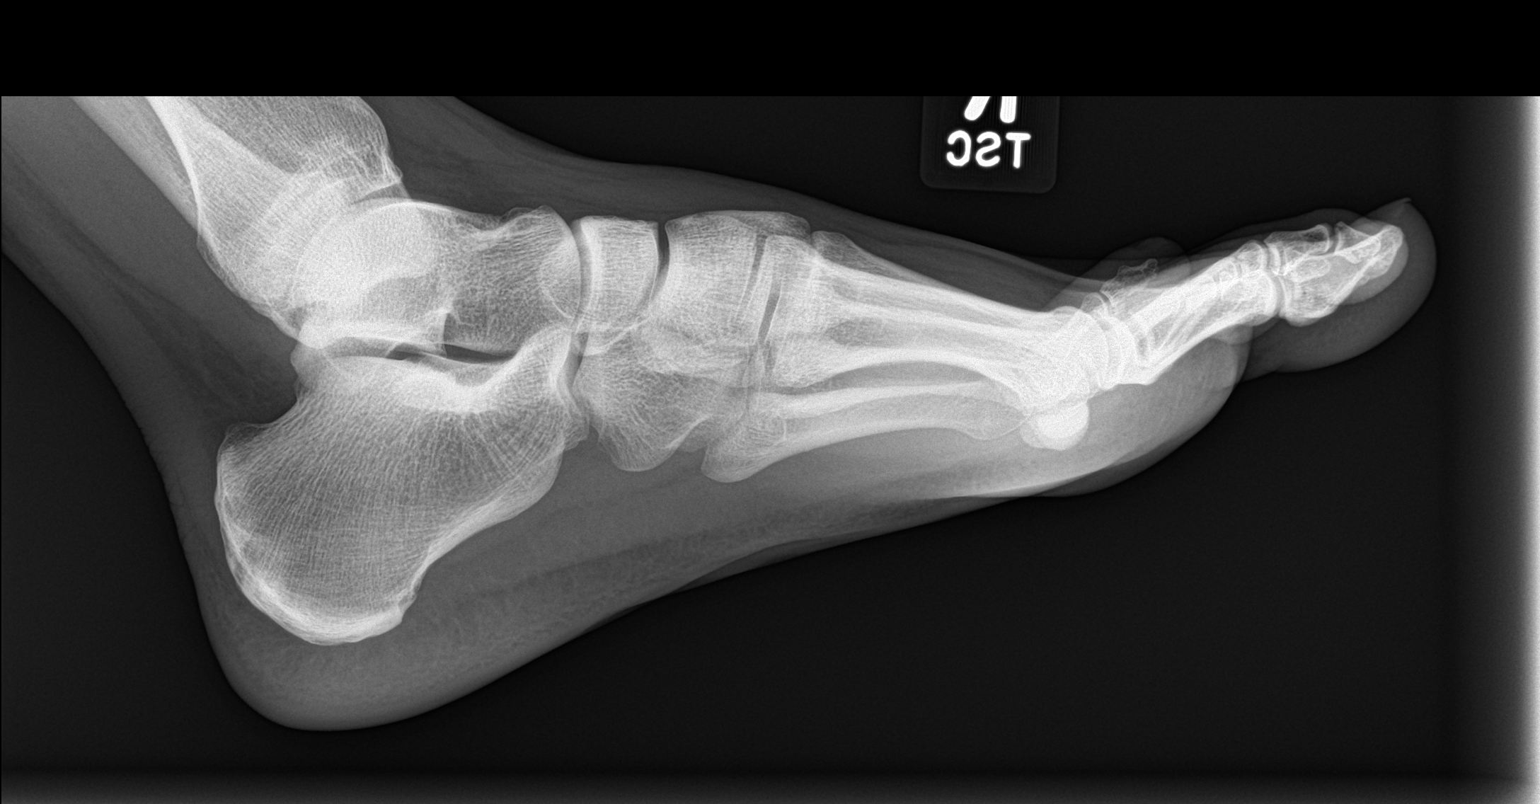

[3 of 3 positions shown; findings below may reference images not displayed]

FINDINGS: Fracture of the base of the distal phalanx of the right fifth digit
is noted. Subtle fracture of the distal aspect of the right fifth
metatarsal cannot be excluded. No other focal abnormalities
identified. No radiopaque foreign bodies.
IMPRESSION: Subtle nondisplaced fractures of the base of the distal phalanx of
the right fifth digit and along the distal aspect of the right fifth
metatarsal cannot be excluded .

## 2018-11-16 ENCOUNTER — Other Ambulatory Visit (INDEPENDENT_AMBULATORY_CARE_PROVIDER_SITE_OTHER): Payer: 59

## 2018-11-16 ENCOUNTER — Other Ambulatory Visit: Payer: Self-pay

## 2018-11-16 DIAGNOSIS — Z0000 Encounter for general adult medical examination without abnormal findings: Secondary | ICD-10-CM

## 2018-11-16 LAB — COMPREHENSIVE METABOLIC PANEL
ALT: 18 U/L (ref 0–53)
AST: 21 U/L (ref 0–37)
Albumin: 4.7 g/dL (ref 3.5–5.2)
Alkaline Phosphatase: 42 U/L (ref 39–117)
BUN: 13 mg/dL (ref 6–23)
CO2: 32 mEq/L (ref 19–32)
Calcium: 9.9 mg/dL (ref 8.4–10.5)
Chloride: 103 mEq/L (ref 96–112)
Creatinine, Ser: 0.92 mg/dL (ref 0.40–1.50)
GFR: 97.38 mL/min (ref >60.00)
Glucose, Bld: 93 mg/dL (ref 70–99)
Potassium: 4.5 mEq/L (ref 3.5–5.1)
Sodium: 141 mEq/L (ref 135–145)
Total Bilirubin: 0.5 mg/dL (ref 0.2–1.2)
Total Protein: 7.2 g/dL (ref 6.0–8.3)

## 2018-11-16 LAB — LIPID PANEL
Cholesterol: 176 mg/dL (ref 0–200)
HDL: 59.3 mg/dL (ref >39.00)
LDL Cholesterol: 97 mg/dL (ref 0–99)
NonHDL: 116.38
Total CHOL/HDL Ratio: 3
Triglycerides: 95 mg/dL (ref 0.0–149.0)
VLDL: 19 mg/dL (ref 0.0–40.0)

## 2018-11-23 ENCOUNTER — Encounter: Payer: Self-pay | Admitting: Primary Care

## 2018-11-23 ENCOUNTER — Other Ambulatory Visit: Payer: Self-pay

## 2018-11-23 ENCOUNTER — Ambulatory Visit (INDEPENDENT_AMBULATORY_CARE_PROVIDER_SITE_OTHER): Payer: 59 | Admitting: Primary Care

## 2018-11-23 VITALS — BP 120/77 | HR 74 | Temp 98.7°F | Wt 151.0 lb

## 2018-11-23 DIAGNOSIS — L301 Dyshidrosis [pompholyx]: Secondary | ICD-10-CM

## 2018-11-23 DIAGNOSIS — Z0000 Encounter for general adult medical examination without abnormal findings: Secondary | ICD-10-CM

## 2018-11-23 NOTE — Assessment & Plan Note (Signed)
Intermittent, improves when not using harsh hand sanitizer. Continue to monitor.

## 2018-11-23 NOTE — Progress Notes (Signed)
Subjective:    Patient ID: Oscar Armstrong, male    DOB: 1990-11-24, 28 y.o.   MRN: [id number]  HPI  Virtual Visit via Video Note  I connected with Oscar Armstrong on 11/23/18 at  2:20 PM EDT by a video enabled telemedicine application and verified that I am speaking with the correct person using two identifiers.  Location: Patient: [name] Provider: Office   I discussed the limitations of evaluation and management by telemedicine and the availability of in person appointments. The patient expressed understanding and agreed to proceed.  History of Present Illness:  Oscar Armstrong is a 28 year old male who presents today for complete physical.  Immunizations: -Tetanus: Completed in 2017 -Influenza: Due this season   Diet: He endorses a healthy diet with fruit, vegetables, starch. Little meat, take out food once monthly. Drinking mostly water and coffee.  Exercise: He is active at work.   Eye exam:  Completed in 2019 Dental exam: Completes semi-annually   BP Readings from Last 3 Encounters:  11/23/18 120/77  03/30/18 118/82  10/06/17 116/72     Observations/Objective:  Alert and oriented. Appears well, not sickly. No distress. Speaking in complete sentences.   Assessment and Plan:  See problem based charting.  Follow Up Instructions:  Continue to eat a healthy diet. Continue with regular exercise.  Ensure you are consuming 64 ounces of water daily.  It was a pleasure to see you today!    I discussed the assessment and treatment plan with the patient. The patient was provided an opportunity to ask questions and all were answered. The patient agreed with the plan and demonstrated an understanding of the instructions.   The patient was advised to call back or seek an in-person evaluation if the symptoms worsen or if the condition fails to improve as anticipated.     Pleas Koch, NP     Review of Systems  Constitutional: Negative for unexpected weight  change.  HENT: Negative for rhinorrhea.   Respiratory: Negative for cough and shortness of breath.   Cardiovascular: Negative for chest pain.  Gastrointestinal: Negative for constipation and diarrhea.  Genitourinary: Negative for difficulty urinating.  Musculoskeletal: Negative for arthralgias.  Skin: Negative for rash.  Allergic/Immunologic: Negative for environmental allergies.  Neurological: Negative for dizziness, numbness and headaches.  Psychiatric/Behavioral: The patient is not nervous/anxious.        Past Medical History:  Diagnosis Date  . Dyshidrotic eczema      Social History   Socioeconomic History  . Marital status: Married    Spouse name: Not on file  . Number of children: Not on file  . Years of education: Not on file  . Highest education level: Not on file  Occupational History  . Not on file  Social Needs  . Financial resource strain: Not on file  . Food insecurity    Worry: Not on file    Inability: Not on file  . Transportation needs    Medical: Not on file    Non-medical: Not on file  Tobacco Use  . Smoking status: Never Smoker  . Smokeless tobacco: Never Used  Substance and Sexual Activity  . Alcohol use: No    Alcohol/week: 0.0 standard drinks  . Drug use: No  . Sexual activity: Not on file  Lifestyle  . Physical activity    Days per week: Not on file    Minutes per session: Not on file  . Stress: Not on file  Relationships  .  Social Herbalist on phone: Not on file    Gets together: Not on file    Attends religious service: Not on file    Active member of club or organization: Not on file    Attends meetings of clubs or organizations: Not on file    Relationship status: Not on file  . Intimate partner violence    Fear of current or ex partner: Not on file    Emotionally abused: Not on file    Physically abused: Not on file    Forced sexual activity: Not on file  Other Topics Concern  . Not on file  Social History  Narrative   Married.   1 son.   Works as a Community education officer.   Enjoys Presenter, broadcasting games, spending time with family, church.        Past Surgical History:  Procedure Laterality Date  . WISDOM TOOTH EXTRACTION      Family History  Problem Relation Age of Onset  . Arthritis Mother   . Hyperlipidemia Mother   . Hypertension Mother   . Hyperlipidemia Maternal Aunt   . Hyperlipidemia Maternal Grandmother   . Kennedy's disease Maternal Grandfather     No Known Allergies  No current outpatient medications on file prior to visit.   No current facility-administered medications on file prior to visit.     BP 120/77   Pulse 74   Temp 98.7 F (37.1 C) (Oral)   Wt 151 lb (68.5 kg)   BMI 22.63 kg/m    Objective:   Physical Exam  Constitutional: He is oriented to person, place, and time. He appears well-nourished.  HENT:  Mouth/Throat: Oropharynx is clear and moist. No oropharyngeal exudate.  Eyes: Conjunctivae are normal.  Neck: Neck supple.  Cardiovascular: Normal rate.  Respiratory: Effort normal.  Musculoskeletal: Normal range of motion.  Neurological: He is alert and oriented to person, place, and time.  Psychiatric: He has a normal mood and affect.           Assessment & Plan:

## 2018-11-23 NOTE — Assessment & Plan Note (Signed)
Immunizations UTD. Commended him on a healthy diet and regular exercise. Virtual exam benign. Labs reviewed. Follow up in 1 year for CPE.

## 2018-11-23 NOTE — Patient Instructions (Signed)
Continue to eat a healthy diet. Continue with regular exercise.  Ensure you are consuming 64 ounces of water daily.  It was a pleasure to see you today!

## 2019-10-27 ENCOUNTER — Other Ambulatory Visit: Payer: Self-pay | Admitting: Primary Care

## 2019-10-27 DIAGNOSIS — Z0000 Encounter for general adult medical examination without abnormal findings: Secondary | ICD-10-CM

## 2019-11-11 ENCOUNTER — Telehealth: Payer: Self-pay | Admitting: Primary Care

## 2019-11-11 ENCOUNTER — Encounter: Payer: Self-pay | Admitting: Primary Care

## 2019-11-11 NOTE — Telephone Encounter (Signed)
Attempted to contact patient again and reschedule appointment. Mailing letter to reschedule physical.

## 2019-11-14 NOTE — Telephone Encounter (Signed)
Oscar Armstrong - Client Nonclinical Telephone Record  [facility] Client Oscar Armstrong - Client Client Site [place] Physician [name] - NP Contact Type Call Who Is Calling Patient / Member / Family / Caregiver Caller Name [place] Phone Number [phone] Patient Name Oscar Armstrong Patient DOB April 16, 1990 Call Type Message Only Information Provided Reason for Call Request for General Office Information Initial Comment Caller states [name] phone to try and reschedule 8/17 appt. Additional Comment They called and said she would be out of town 8/17 and need to reschedule. Disp. Time Disposition Final User 11/11/2019 5:02:52 PM General Information Provided Yes Clayton Bibles Call Closed By: Clayton Bibles Transaction Date/Time: 11/11/2019 4:59:12 PM (ET)

## 2019-11-17 NOTE — Telephone Encounter (Signed)
Called and rsc'd patient for cpe.

## 2019-11-22 ENCOUNTER — Other Ambulatory Visit (INDEPENDENT_AMBULATORY_CARE_PROVIDER_SITE_OTHER): Payer: 59

## 2019-11-22 ENCOUNTER — Other Ambulatory Visit: Payer: Self-pay

## 2019-11-22 DIAGNOSIS — Z0000 Encounter for general adult medical examination without abnormal findings: Secondary | ICD-10-CM

## 2019-11-22 LAB — CBC
HCT: 41.9 % (ref 39.0–52.0)
Hemoglobin: 14.5 g/dL (ref 13.0–17.0)
MCHC: 34.7 g/dL (ref 30.0–36.0)
MCV: 86.6 fl (ref 78.0–100.0)
Platelets: 234 10*3/uL (ref 150.0–400.0)
RBC: 4.83 Mil/uL (ref 4.22–5.81)
RDW: 12.9 % (ref 11.5–15.5)
WBC: 4.2 10*3/uL (ref 4.0–10.5)

## 2019-11-22 LAB — COMPREHENSIVE METABOLIC PANEL
ALT: 38 U/L (ref 0–53)
AST: 37 U/L (ref 0–37)
Albumin: 4.6 g/dL (ref 3.5–5.2)
Alkaline Phosphatase: 36 U/L — ABNORMAL LOW (ref 39–117)
BUN: 15 mg/dL (ref 6–23)
CO2: 30 mEq/L (ref 19–32)
Calcium: 9.7 mg/dL (ref 8.4–10.5)
Chloride: 102 mEq/L (ref 96–112)
Creatinine, Ser: 1.07 mg/dL (ref 0.40–1.50)
GFR: 81.23 mL/min (ref >60.00)
Glucose, Bld: 96 mg/dL (ref 70–99)
Potassium: 4.2 mEq/L (ref 3.5–5.1)
Sodium: 139 mEq/L (ref 135–145)
Total Bilirubin: 0.7 mg/dL (ref 0.2–1.2)
Total Protein: 7.5 g/dL (ref 6.0–8.3)

## 2019-11-22 LAB — LIPID PANEL
Cholesterol: 169 mg/dL (ref 0–200)
HDL: 55.6 mg/dL (ref >39.00)
LDL Cholesterol: 89 mg/dL (ref 0–99)
NonHDL: 113.5
Total CHOL/HDL Ratio: 3
Triglycerides: 123 mg/dL (ref 0.0–149.0)
VLDL: 24.6 mg/dL (ref 0.0–40.0)

## 2019-11-29 ENCOUNTER — Encounter: Payer: 59 | Admitting: Primary Care

## 2019-12-05 ENCOUNTER — Ambulatory Visit (INDEPENDENT_AMBULATORY_CARE_PROVIDER_SITE_OTHER): Payer: 59 | Admitting: Primary Care

## 2019-12-05 ENCOUNTER — Other Ambulatory Visit: Payer: Self-pay

## 2019-12-05 VITALS — BP 116/72 | HR 54 | Temp 96.4°F | Ht 68.5 in | Wt 154.5 lb

## 2019-12-05 DIAGNOSIS — D229 Melanocytic nevi, unspecified: Secondary | ICD-10-CM

## 2019-12-05 DIAGNOSIS — Z0000 Encounter for general adult medical examination without abnormal findings: Secondary | ICD-10-CM | POA: Diagnosis not present

## 2019-12-05 NOTE — Progress Notes (Signed)
Subjective:    Patient ID: Oscar Armstrong, male    DOB: 20-Apr-1990, 29 y.o.   MRN: [id number]  HPI  This visit occurred during the SARS-CoV-2 public health emergency.  Safety protocols were in place, including screening questions prior to the visit, additional usage of staff PPE, and extensive cleaning of exam room while observing appropriate contact time as indicated for disinfecting solutions.   Oscar Armstrong is a 29 year old male who presents today for complete physical.  He would also like for me to view a nevus to the groin. The nevus has been present since the age of 29, but he's noticed a slight change in size recently.   Immunizations: -Tetanus: Completed in 2017 -Influenza: Due this season  -Covid-19: Completed initial dose  Diet: He endorses a healthy diet.  Exercise: He is running 2-5 miles daily   Eye exam: Completed in 2021 Dental exam: Completes semi-annually   BP Readings from Last 3 Encounters:  12/05/19 116/72  11/23/18 120/77  03/30/18 118/82     Review of Systems  Constitutional: Negative for unexpected weight change.  HENT: Negative for rhinorrhea.   Respiratory: Negative for cough and shortness of breath.   Cardiovascular: Negative for chest pain.  Gastrointestinal: Negative for constipation and diarrhea.  Genitourinary: Negative for difficulty urinating.  Musculoskeletal: Negative for arthralgias and myalgias.  Skin: Negative for rash.  Allergic/Immunologic: Negative for environmental allergies.  Neurological: Negative for dizziness, numbness and headaches.  Psychiatric/Behavioral: The patient is not nervous/anxious.        Past Medical History:  Diagnosis Date  . Dyshidrotic eczema      Social History   Socioeconomic History  . Marital status: Married    Spouse name: Not on file  . Number of children: Not on file  . Years of education: Not on file  . Highest education level: Not on file  Occupational History  . Not on file  Tobacco Use    . Smoking status: Never Smoker  . Smokeless tobacco: Never Used  Substance and Sexual Activity  . Alcohol use: No    Alcohol/week: 0.0 standard drinks  . Drug use: No  . Sexual activity: Not on file  Other Topics Concern  . Not on file  Social History Narrative   Married.   1 son.   Works as a Community education officer.   Enjoys Presenter, broadcasting games, spending time with family, church.       Social Determinants of Health   Financial Resource Strain:   . Difficulty of Paying Living Expenses: Not on file  Food Insecurity:   . Worried About Charity fundraiser in the Last Year: Not on file  . Ran Out of Food in the Last Year: Not on file  Transportation Needs:   . Lack of Transportation (Medical): Not on file  . Lack of Transportation (Non-Medical): Not on file  Physical Activity:   . Days of Exercise per Week: Not on file  . Minutes of Exercise per Session: Not on file  Stress:   . Feeling of Stress : Not on file  Social Connections:   . Frequency of Communication with Friends and Family: Not on file  . Frequency of Social Gatherings with Friends and Family: Not on file  . Attends Religious Services: Not on file  . Active Member of Clubs or Organizations: Not on file  . Attends Archivist Meetings: Not on file  . Marital Status: Not on file  Intimate Partner Violence:   .  Fear of Current or Ex-Partner: Not on file  . Emotionally Abused: Not on file  . Physically Abused: Not on file  . Sexually Abused: Not on file    Past Surgical History:  Procedure Laterality Date  . WISDOM TOOTH EXTRACTION      Family History  Problem Relation Age of Onset  . Arthritis Mother   . Hyperlipidemia Mother   . Hypertension Mother   . Hyperlipidemia Maternal Aunt   . Hyperlipidemia Maternal Grandmother   . Kennedy's disease Maternal Grandfather     No Known Allergies  No current outpatient medications on file prior to visit.   No current facility-administered  medications on file prior to visit.    BP 116/72   Pulse (!) 54   Temp (!) 96.4 F (35.8 C) (Temporal)   Ht 5' 8.5" (1.74 m)   Wt 154 lb 8 oz (70.1 kg)   SpO2 98%   BMI 23.15 kg/m    Objective:   Physical Exam HENT:     Right Ear: Tympanic membrane and ear canal normal.     Left Ear: Tympanic membrane and ear canal normal.  Eyes:     Pupils: Pupils are equal, round, and reactive to light.  Cardiovascular:     Rate and Rhythm: Normal rate and regular rhythm.  Pulmonary:     Effort: Pulmonary effort is normal.     Breath sounds: Normal breath sounds.  Abdominal:     General: Bowel sounds are normal.     Palpations: Abdomen is soft.     Tenderness: There is no abdominal tenderness.  Musculoskeletal:        General: Normal range of motion.     Cervical back: Neck supple.  Skin:    General: Skin is warm and dry.     Comments: Raised, dark brown, skin tag vs nevus to left anterior groin slightly proximal to penis. Chaperone present.   Neurological:     Mental Status: He is alert and oriented to person, place, and time.     Cranial Nerves: No cranial nerve deficit.     Deep Tendon Reflexes:     Reflex Scores:      Patellar reflexes are 2+ on the right side and 2+ on the left side. Psychiatric:        Mood and Affect: Mood normal.            Assessment & Plan:

## 2019-12-05 NOTE — Assessment & Plan Note (Signed)
Immunizations UTD. Commended him on a healthy lifestyle. Exam today unremarkable. Labs reviewed.

## 2019-12-05 NOTE — Assessment & Plan Note (Signed)
Chronic to left anterior groin proximal to penis, appears benign. Discussed to monitor for changes in size, shape, color.

## 2019-12-05 NOTE — Patient Instructions (Signed)
Continue exercising. You should be getting 150 minutes of moderate intensity exercise weekly.  Continue a healthy diet. Ensure you are consuming 64 ounces of water daily.  It was a pleasure to see you today!   Preventive Care 58-30 Years Old, Male Preventive care refers to lifestyle choices and visits with your health care provider that can promote health and wellness. This includes:  A yearly physical exam. This is also called an annual well check.  Regular dental and eye exams.  Immunizations.  Screening for certain conditions.  Healthy lifestyle choices, such as eating a healthy diet, getting regular exercise, not using drugs or products that contain nicotine and tobacco, and limiting alcohol use. What can I expect for my preventive care visit? Physical exam Your health care provider will check:  Height and weight. These may be used to calculate body mass index (BMI), which is a measurement that tells if you are at a healthy weight.  Heart rate and blood pressure.  Your skin for abnormal spots. Counseling Your health care provider may ask you questions about:  Alcohol, tobacco, and drug use.  Emotional well-being.  Home and relationship well-being.  Sexual activity.  Eating habits.  Work and work Statistician. What immunizations do I need?  Influenza (flu) vaccine  This is recommended every year. Tetanus, diphtheria, and pertussis (Tdap) vaccine  You may need a Td booster every 10 years. Varicella (chickenpox) vaccine  You may need this vaccine if you have not already been vaccinated. Human papillomavirus (HPV) vaccine  If recommended by your health care provider, you may need three doses over 6 months. Measles, mumps, and rubella (MMR) vaccine  You may need at least one dose of MMR. You may also need a second dose. Meningococcal conjugate (MenACWY) vaccine  One dose is recommended if you are 59-57 years old and a Market researcher living in a  residence hall, or if you have one of several medical conditions. You may also need additional booster doses. Pneumococcal conjugate (PCV13) vaccine  You may need this if you have certain conditions and were not previously vaccinated. Pneumococcal polysaccharide (PPSV23) vaccine  You may need one or two doses if you smoke cigarettes or if you have certain conditions. Hepatitis A vaccine  You may need this if you have certain conditions or if you travel or work in places where you may be exposed to hepatitis A. Hepatitis B vaccine  You may need this if you have certain conditions or if you travel or work in places where you may be exposed to hepatitis B. Haemophilus influenzae type b (Hib) vaccine  You may need this if you have certain risk factors. You may receive vaccines as individual doses or as more than one vaccine together in one shot (combination vaccines). Talk with your health care provider about the risks and benefits of combination vaccines. What tests do I need? Blood tests  Lipid and cholesterol levels. These may be checked every 5 years starting at age 17.  Hepatitis C test.  Hepatitis B test. Screening   Diabetes screening. This is done by checking your blood sugar (glucose) after you have not eaten for a while (fasting).  Sexually transmitted disease (STD) testing. Talk with your health care provider about your test results, treatment options, and if necessary, the need for more tests. Follow these instructions at home: Eating and drinking   Eat a diet that includes fresh fruits and vegetables, whole grains, lean protein, and low-fat dairy products.  Take vitamin  and mineral supplements as recommended by your health care provider.  Do not drink alcohol if your health care provider tells you not to drink.  If you drink alcohol: ? Limit how much you have to 0-2 drinks a day. ? Be aware of how much alcohol is in your drink. In the [place] one drink equals one 12  oz bottle of beer (355 mL), one 5 oz glass of wine (148 mL), or one 1 oz glass of hard liquor (44 mL). Lifestyle  Take daily care of your teeth and gums.  Stay active. Exercise for at least 30 minutes on 5 or more days each week.  Do not use any products that contain nicotine or tobacco, such as cigarettes, e-cigarettes, and chewing tobacco. If you need help quitting, ask your health care provider.  If you are sexually active, practice safe sex. Use a condom or other form of protection to prevent STIs (sexually transmitted infections). What's next?  Go to your health care provider once a year for a well check visit.  Ask your health care provider how often you should have your eyes and teeth checked.  Stay up to date on all vaccines. This information is not intended to replace advice given to you by your health care provider. Make sure you discuss any questions you have with your health care provider. Document Revised: 03/25/2018 Document Reviewed: 03/25/2018 Elsevier Patient Education  2020 [employer].

## 2021-05-15 HISTORY — PX: VASECTOMY: SHX75

## 2021-06-10 DIAGNOSIS — Z3009 Encounter for other general counseling and advice on contraception: Secondary | ICD-10-CM | POA: Diagnosis not present

## 2021-07-05 DIAGNOSIS — Z302 Encounter for sterilization: Secondary | ICD-10-CM | POA: Diagnosis not present

## 2021-10-08 ENCOUNTER — Ambulatory Visit (INDEPENDENT_AMBULATORY_CARE_PROVIDER_SITE_OTHER): Payer: 59 | Admitting: Primary Care

## 2021-10-08 ENCOUNTER — Other Ambulatory Visit: Payer: Self-pay | Admitting: Primary Care

## 2021-10-08 ENCOUNTER — Encounter: Payer: Self-pay | Admitting: Primary Care

## 2021-10-08 VITALS — BP 118/72 | HR 76 | Temp 98.5°F | Ht 68.5 in | Wt 158.0 lb

## 2021-10-08 DIAGNOSIS — L301 Dyshidrosis [pompholyx]: Secondary | ICD-10-CM | POA: Diagnosis not present

## 2021-10-08 DIAGNOSIS — R7989 Other specified abnormal findings of blood chemistry: Secondary | ICD-10-CM

## 2021-10-08 DIAGNOSIS — Z0000 Encounter for general adult medical examination without abnormal findings: Secondary | ICD-10-CM

## 2021-10-08 DIAGNOSIS — Z114 Encounter for screening for human immunodeficiency virus [HIV]: Secondary | ICD-10-CM | POA: Diagnosis not present

## 2021-10-08 DIAGNOSIS — Z1159 Encounter for screening for other viral diseases: Secondary | ICD-10-CM | POA: Diagnosis not present

## 2021-10-08 LAB — CBC
HCT: 43.9 % (ref 39.0–52.0)
Hemoglobin: 15 g/dL (ref 13.0–17.0)
MCHC: 34.2 g/dL (ref 30.0–36.0)
MCV: 87.1 fl (ref 78.0–100.0)
Platelets: 223 10*3/uL (ref 150.0–400.0)
RBC: 5.04 Mil/uL (ref 4.22–5.81)
RDW: 13.2 % (ref 11.5–15.5)
WBC: 3.9 10*3/uL — ABNORMAL LOW (ref 4.0–10.5)

## 2021-10-08 LAB — COMPREHENSIVE METABOLIC PANEL
ALT: 68 U/L — ABNORMAL HIGH (ref 0–53)
AST: 58 U/L — ABNORMAL HIGH (ref 0–37)
Albumin: 4.7 g/dL (ref 3.5–5.2)
Alkaline Phosphatase: 47 U/L (ref 39–117)
BUN: 17 mg/dL (ref 6–23)
CO2: 28 mEq/L (ref 19–32)
Calcium: 10.1 mg/dL (ref 8.4–10.5)
Chloride: 101 mEq/L (ref 96–112)
Creatinine, Ser: 1.03 mg/dL (ref 0.40–1.50)
GFR: 96.64 mL/min (ref >60.00)
Glucose, Bld: 93 mg/dL (ref 70–99)
Potassium: 4.5 mEq/L (ref 3.5–5.1)
Sodium: 137 mEq/L (ref 135–145)
Total Bilirubin: 0.6 mg/dL (ref 0.2–1.2)
Total Protein: 7.3 g/dL (ref 6.0–8.3)

## 2021-10-08 LAB — LIPID PANEL
Cholesterol: 187 mg/dL (ref 0–200)
HDL: 59.4 mg/dL (ref >39.00)
LDL Cholesterol: 88 mg/dL (ref 0–99)
NonHDL: 127.69
Total CHOL/HDL Ratio: 3
Triglycerides: 199 mg/dL — ABNORMAL HIGH (ref 0.0–149.0)
VLDL: 39.8 mg/dL (ref 0.0–40.0)

## 2021-10-08 NOTE — Assessment & Plan Note (Signed)
Controlled with Lanolin.  Continue to monitor.

## 2021-10-08 NOTE — Progress Notes (Signed)
Subjective:    Patient ID: Oscar Armstrong, male    DOB: June 13, 1990, 31 y.o.   MRN: [id number]  HPI  Oscar Armstrong is a very pleasant 31 y.o. male who presents today for complete physical and follow up of chronic conditions.  Immunizations: -Tetanus: 2017 -Influenza: Completed last season  -Covid-19: Completed 3 doses   Diet: Healthy diet. Exercise: Regular exercise.   Eye exam: Completes annually  Dental exam: Completes semi-annually   BP Readings from Last 3 Encounters:  10/08/21 118/72  12/05/19 116/72  11/23/18 120/77        Review of Systems  Constitutional:  Negative for unexpected weight change.  HENT:  Negative for rhinorrhea.   Respiratory:  Negative for cough and shortness of breath.   Cardiovascular:  Negative for chest pain.  Gastrointestinal:  Negative for constipation and diarrhea.  Genitourinary:  Negative for difficulty urinating.  Musculoskeletal:  Negative for arthralgias and myalgias.  Skin:  Negative for rash.  Allergic/Immunologic: Negative for environmental allergies.  Neurological:  Negative for dizziness and headaches.  Psychiatric/Behavioral:  The patient is not nervous/anxious.          Past Medical History:  Diagnosis Date   Dyshidrotic eczema     Social History   Socioeconomic History   Marital status: Married    Spouse name: Not on file   Number of children: Not on file   Years of education: Not on file   Highest education level: Not on file  Occupational History   Not on file  Tobacco Use   Smoking status: Never   Smokeless tobacco: Never  Substance and Sexual Activity   Alcohol use: No    Alcohol/week: 0.0 standard drinks of alcohol   Drug use: No   Sexual activity: Not on file  Other Topics Concern   Not on file  Social History Narrative   Married.   1 son.   Works as a Adult nurse.   Enjoys Advertising copywriter games, spending time with family, church.       Social Determinants of Health   Financial  Resource Strain: Not on file  Food Insecurity: Not on file  Transportation Needs: Not on file  Physical Activity: Not on file  Stress: Not on file  Social Connections: Not on file  Intimate Partner Violence: Not on file    Past Surgical History:  Procedure Laterality Date   VASECTOMY  05/15/2021   WISDOM TOOTH EXTRACTION      Family History  Problem Relation Age of Onset   Arthritis Mother    Hyperlipidemia Mother    Hypertension Mother    Hyperlipidemia Maternal Aunt    Hyperlipidemia Maternal Grandmother    Kennedy's disease Maternal Grandfather     No Known Allergies  No current outpatient medications on file prior to visit.   No current facility-administered medications on file prior to visit.    BP 118/72   Pulse 76   Temp 98.5 F (36.9 C) (Oral)   Ht 5' 8.5" (1.74 m)   Wt 158 lb (71.7 kg)   SpO2 98%   BMI 23.67 kg/m  Objective:   Physical Exam HENT:     Right Ear: Tympanic membrane and ear canal normal.     Left Ear: Tympanic membrane and ear canal normal.     Nose: Nose normal.     Right Sinus: No maxillary sinus tenderness or frontal sinus tenderness.     Left Sinus: No maxillary sinus tenderness or frontal sinus tenderness.  Eyes:     Conjunctiva/sclera: Conjunctivae normal.  Neck:     Thyroid: No thyromegaly.     Vascular: No carotid bruit.  Cardiovascular:     Rate and Rhythm: Normal rate and regular rhythm.     Heart sounds: Normal heart sounds.  Pulmonary:     Effort: Pulmonary effort is normal.     Breath sounds: Normal breath sounds. No wheezing or rales.  Abdominal:     General: Bowel sounds are normal.     Palpations: Abdomen is soft.     Tenderness: There is no abdominal tenderness.  Musculoskeletal:        General: Normal range of motion.     Cervical back: Neck supple.  Skin:    General: Skin is warm and dry.  Neurological:     Mental Status: He is alert and oriented to person, place, and time.     Cranial Nerves: No cranial  nerve deficit.     Deep Tendon Reflexes: Reflexes are normal and symmetric.  Psychiatric:        Mood and Affect: Mood normal.           Assessment & Plan:   Problem List Items Addressed This Visit       Musculoskeletal and Integument   Dyshidrotic eczema    Controlled with Lanolin.  Continue to monitor.         Other   Preventative health care - Primary    Immunizations UTD.  Discussed the importance of a healthy diet and regular exercise in order for weight loss, and to reduce the risk of further co-morbidity.  Exam stable. Labs pending.  Follow up in 1 year for repeat physical.       Relevant Orders   Lipid panel   Comprehensive metabolic panel   CBC   Other Visit Diagnoses     Screening for HIV (human immunodeficiency virus)       Relevant Orders   HIV antibody (with reflex)   Encounter for hepatitis C screening test for low risk patient       Relevant Orders   Hepatitis C Antibody          Doreene Nest, NP

## 2021-10-09 LAB — HIV ANTIBODY (ROUTINE TESTING W REFLEX): HIV 1&2 Ab, 4th Generation: NONREACTIVE

## 2021-10-09 LAB — HEPATITIS C ANTIBODY: Hepatitis C Ab: NONREACTIVE

## 2022-10-21 ENCOUNTER — Encounter: Payer: Self-pay | Admitting: Primary Care

## 2022-10-21 ENCOUNTER — Ambulatory Visit (INDEPENDENT_AMBULATORY_CARE_PROVIDER_SITE_OTHER): Payer: 59 | Admitting: Primary Care

## 2022-10-21 VITALS — BP 116/66 | HR 65 | Temp 98.3°F | Ht 68.5 in | Wt 161.0 lb

## 2022-10-21 DIAGNOSIS — Z0000 Encounter for general adult medical examination without abnormal findings: Secondary | ICD-10-CM | POA: Diagnosis not present

## 2022-10-21 DIAGNOSIS — L301 Dyshidrosis [pompholyx]: Secondary | ICD-10-CM

## 2022-10-21 DIAGNOSIS — R7989 Other specified abnormal findings of blood chemistry: Secondary | ICD-10-CM | POA: Diagnosis not present

## 2022-10-21 LAB — CBC
HCT: 43.1 % (ref 39.0–52.0)
Hemoglobin: 14.2 g/dL (ref 13.0–17.0)
MCHC: 33 g/dL (ref 30.0–36.0)
MCV: 87.3 fl (ref 78.0–100.0)
Platelets: 222 10*3/uL (ref 150.0–400.0)
RBC: 4.94 Mil/uL (ref 4.22–5.81)
RDW: 13.2 % (ref 11.5–15.5)
WBC: 4 10*3/uL (ref 4.0–10.5)

## 2022-10-21 LAB — LIPID PANEL
Cholesterol: 169 mg/dL (ref 0–200)
HDL: 56.1 mg/dL (ref >39.00)
LDL Cholesterol: 92 mg/dL (ref 0–99)
NonHDL: 112.82
Total CHOL/HDL Ratio: 3
Triglycerides: 104 mg/dL (ref 0.0–149.0)
VLDL: 20.8 mg/dL (ref 0.0–40.0)

## 2022-10-21 LAB — COMPREHENSIVE METABOLIC PANEL
ALT: 36 U/L (ref 0–53)
AST: 39 U/L — ABNORMAL HIGH (ref 0–37)
Albumin: 4.5 g/dL (ref 3.5–5.2)
Alkaline Phosphatase: 46 U/L (ref 39–117)
BUN: 19 mg/dL (ref 6–23)
CO2: 28 mEq/L (ref 19–32)
Calcium: 9.8 mg/dL (ref 8.4–10.5)
Chloride: 105 mEq/L (ref 96–112)
Creatinine, Ser: 0.89 mg/dL (ref 0.40–1.50)
GFR: 113.19 mL/min (ref >60.00)
Glucose, Bld: 97 mg/dL (ref 70–99)
Potassium: 3.9 mEq/L (ref 3.5–5.1)
Sodium: 139 mEq/L (ref 135–145)
Total Bilirubin: 0.6 mg/dL (ref 0.2–1.2)
Total Protein: 6.9 g/dL (ref 6.0–8.3)

## 2022-10-21 NOTE — Assessment & Plan Note (Signed)
Immunizations UTD.  Discussed the importance of a healthy diet and regular exercise in order for weight loss, and to reduce the risk of further co-morbidity.  Exam stable. Labs pending.  Follow up in 1 year for repeat physical.  

## 2022-10-21 NOTE — Assessment & Plan Note (Signed)
Controlled.  Continue Lanolin.  Continue to monitor.

## 2022-10-21 NOTE — Progress Notes (Signed)
Subjective:    Patient ID: Oscar Armstrong, male    DOB: [birth year], 32 y.o.   MRN: [id number]  HPI  Oscar Armstrong is a very pleasant 32 y.o. male who presents today for complete physical and follow up of chronic conditions.  Immunizations: -Tetanus: Completed in 2017  Diet:  Healthy diet Exercise: Exercising 4-6 days weekly   Eye exam: Completes annually  Dental exam: Completes semi-annually    BP Readings from Last 3 Encounters:  10/21/22 116/66  10/08/21 118/72  12/05/19 116/72        Review of Systems  Constitutional:  Negative for unexpected weight change.  HENT:  Negative for rhinorrhea.   Eyes:  Negative for visual disturbance.  Respiratory:  Negative for cough and shortness of breath.   Cardiovascular:  Negative for chest pain.  Gastrointestinal:  Negative for constipation and diarrhea.  Genitourinary:  Negative for difficulty urinating.  Musculoskeletal:  Negative for arthralgias and myalgias.  Skin:  Positive for rash.  Allergic/Immunologic: Negative for environmental allergies.  Neurological:  Negative for dizziness and headaches.  Psychiatric/Behavioral:  The patient is not nervous/anxious.          Past Medical History:  Diagnosis Date   Dyshidrotic eczema     Social History   Socioeconomic History   Marital status: Married    Spouse name: Not on file   Number of children: Not on file   Years of education: Not on file   Highest education level: Not on file  Occupational History   Not on file  Tobacco Use   Smoking status: Never   Smokeless tobacco: Never  Substance and Sexual Activity   Alcohol use: No    Alcohol/week: 0.0 standard drinks of alcohol   Drug use: No   Sexual activity: Not on file  Other Topics Concern   Not on file  Social History Narrative   Married.   1 son.   Works as a Adult nurse.   Enjoys Advertising copywriter games, spending time with family, church.       Social Determinants of Health   Financial  Resource Strain: Not on file  Food Insecurity: Not on file  Transportation Needs: Not on file  Physical Activity: Not on file  Stress: Not on file  Social Connections: Not on file  Intimate Partner Violence: Not on file    Past Surgical History:  Procedure Laterality Date   VASECTOMY  05/15/2021   WISDOM TOOTH EXTRACTION      Family History  Problem Relation Age of Onset   Arthritis Mother    Hyperlipidemia Mother    Hypertension Mother    Hyperlipidemia Maternal Aunt    Hyperlipidemia Maternal Grandmother    Kennedy's disease Maternal Grandfather     No Known Allergies  No current outpatient medications on file prior to visit.   No current facility-administered medications on file prior to visit.    BP 116/66   Pulse 65   Temp 98.3 F (36.8 C) (Temporal)   Ht 5' 8.5" (1.74 m)   Wt 161 lb (73 kg)   SpO2 98%   BMI 24.12 kg/m  Objective:   Physical Exam HENT:     Right Ear: Tympanic membrane and ear canal normal.     Left Ear: Tympanic membrane and ear canal normal.     Nose: Nose normal.     Right Sinus: No maxillary sinus tenderness or frontal sinus tenderness.     Left Sinus: No maxillary sinus tenderness or frontal  sinus tenderness.  Eyes:     Conjunctiva/sclera: Conjunctivae normal.  Neck:     Thyroid: No thyromegaly.     Vascular: No carotid bruit.  Cardiovascular:     Rate and Rhythm: Normal rate and regular rhythm.     Heart sounds: Normal heart sounds.  Pulmonary:     Effort: Pulmonary effort is normal.     Breath sounds: Normal breath sounds. No wheezing or rales.  Abdominal:     General: Bowel sounds are normal.     Palpations: Abdomen is soft.     Tenderness: There is no abdominal tenderness.  Musculoskeletal:        General: Normal range of motion.     Cervical back: Neck supple.  Skin:    General: Skin is warm and dry.  Neurological:     Mental Status: He is alert and oriented to person, place, and time.     Cranial Nerves: No cranial  nerve deficit.     Deep Tendon Reflexes: Reflexes are normal and symmetric.  Psychiatric:        Mood and Affect: Mood normal.           Assessment & Plan:  Preventative health care Assessment & Plan: Immunizations UTD.  Discussed the importance of a healthy diet and regular exercise in order for weight loss, and to reduce the risk of further co-morbidity.  Exam stable. Labs pending.  Follow up in 1 year for repeat physical.   Orders: -     Lipid panel  Dyshidrotic eczema Assessment & Plan: Controlled.  Continue Lanolin.  Continue to monitor.    Elevated LFTs Assessment & Plan: No obvious provoking cause currently. Repeat LFTs pending.  Continue a healthy diet and exercise.  Orders: -     Lipid panel -     Comprehensive metabolic panel -     CBC        Doreene Nest, NP

## 2022-10-21 NOTE — Assessment & Plan Note (Signed)
No obvious provoking cause currently. Repeat LFTs pending.  Continue a healthy diet and exercise.

## 2022-10-21 NOTE — Patient Instructions (Signed)
Stop by the lab prior to leaving today. I will notify you of your results once received.   It was a pleasure to see you today!  

## 2023-02-25 DIAGNOSIS — L538 Other specified erythematous conditions: Secondary | ICD-10-CM | POA: Diagnosis not present

## 2023-02-25 DIAGNOSIS — D485 Neoplasm of uncertain behavior of skin: Secondary | ICD-10-CM | POA: Diagnosis not present

## 2023-02-25 DIAGNOSIS — R208 Other disturbances of skin sensation: Secondary | ICD-10-CM | POA: Diagnosis not present

## 2023-02-25 DIAGNOSIS — D2262 Melanocytic nevi of left upper limb, including shoulder: Secondary | ICD-10-CM | POA: Diagnosis not present

## 2023-02-25 DIAGNOSIS — L0591 Pilonidal cyst without abscess: Secondary | ICD-10-CM | POA: Diagnosis not present

## 2023-02-25 DIAGNOSIS — D225 Melanocytic nevi of trunk: Secondary | ICD-10-CM | POA: Diagnosis not present

## 2023-02-25 DIAGNOSIS — D2272 Melanocytic nevi of left lower limb, including hip: Secondary | ICD-10-CM | POA: Diagnosis not present

## 2023-02-25 DIAGNOSIS — D2271 Melanocytic nevi of right lower limb, including hip: Secondary | ICD-10-CM | POA: Diagnosis not present

## 2023-02-25 DIAGNOSIS — L821 Other seborrheic keratosis: Secondary | ICD-10-CM | POA: Diagnosis not present

## 2023-02-25 DIAGNOSIS — D2261 Melanocytic nevi of right upper limb, including shoulder: Secondary | ICD-10-CM | POA: Diagnosis not present

## 2023-03-06 DIAGNOSIS — L988 Other specified disorders of the skin and subcutaneous tissue: Secondary | ICD-10-CM | POA: Diagnosis not present

## 2023-04-10 ENCOUNTER — Inpatient Hospital Stay: Admission: RE | Admit: 2023-04-10 | Payer: 59 | Source: Ambulatory Visit

## 2023-04-17 ENCOUNTER — Ambulatory Visit: Admit: 2023-04-17 | Payer: 59 | Admitting: General Surgery

## 2023-04-17 SURGERY — EXCISION, SIMPLE PILONIDAL CYST
Anesthesia: General | Site: Buttocks

## 2023-05-19 ENCOUNTER — Ambulatory Visit (HOSPITAL_BASED_OUTPATIENT_CLINIC_OR_DEPARTMENT_OTHER): Payer: 59 | Admitting: Student

## 2023-05-19 ENCOUNTER — Ambulatory Visit (HOSPITAL_BASED_OUTPATIENT_CLINIC_OR_DEPARTMENT_OTHER): Payer: 59

## 2023-05-19 ENCOUNTER — Other Ambulatory Visit (HOSPITAL_BASED_OUTPATIENT_CLINIC_OR_DEPARTMENT_OTHER): Payer: Self-pay | Admitting: Student

## 2023-05-19 ENCOUNTER — Encounter (HOSPITAL_BASED_OUTPATIENT_CLINIC_OR_DEPARTMENT_OTHER): Payer: Self-pay | Admitting: Student

## 2023-05-19 DIAGNOSIS — Z043 Encounter for examination and observation following other accident: Secondary | ICD-10-CM | POA: Diagnosis not present

## 2023-05-19 DIAGNOSIS — M546 Pain in thoracic spine: Secondary | ICD-10-CM | POA: Diagnosis not present

## 2023-05-19 DIAGNOSIS — M545 Low back pain, unspecified: Secondary | ICD-10-CM

## 2023-05-19 NOTE — Progress Notes (Signed)
 Chief Complaint: Back pain and bilateral foot tingling     History of Present Illness:    Oscar Armstrong is a 33 y.o. male presenting today for evaluation of pain in his mid to low back.  He reports falling 3 days ago while snow boarding at [facility].  He landed directly on his low back/tailbone area however reports that this was not immediately painful.  [place] able [place] and then [place] home.  After this pain began slowly increasing however is a little better today.  Last night he did develop feeling of pins-and-needles in the soles of both feet which has been worsening today.  Overall rates pain at a 7/10 and has taken Advil without relief.  Pain and stiffness in his mid back has also increased today.  No other numbness, tingling, saddle anesthesia, bowel/bladder dysfunction.  Does have history of a pilonidal cyst which she may need surgery for in the future.  He works as a adult nurse at [employer].   Surgical History:   None  PMH/PSH/Family History/Social History/Meds/Allergies:    Past Medical History:  Diagnosis Date   Dyshidrotic eczema    Past Surgical History:  Procedure Laterality Date   VASECTOMY  05/15/2021   WISDOM TOOTH EXTRACTION     Social History   Socioeconomic History   Marital status: Married    Spouse name: Not on file   Number of children: Not on file   Years of education: Not on file   Highest education level: Not on file  Occupational History   Not on file  Tobacco Use   Smoking status: Never   Smokeless tobacco: Never  Substance and Sexual Activity   Alcohol use: No    Alcohol/week: 0.0 standard drinks of alcohol   Drug use: No   Sexual activity: Not on file  Other Topics Concern   Not on file  Social History Narrative   Married.   1 son.   Works as a adult nurse.   Enjoys advertising copywriter games, spending time with family, church.       Social  Drivers of Corporate Investment Banker Strain: Not on file  Food Insecurity: Not on file  Transportation Needs: Not on file  Physical Activity: Not on file  Stress: Not on file  Social Connections: Not on file   Family History  Problem Relation Age of Onset   Arthritis Mother    Hyperlipidemia Mother    Hypertension Mother    Hyperlipidemia Maternal Aunt    Hyperlipidemia Maternal Grandmother    Kennedy's disease Maternal Grandfather    No Known Allergies No current outpatient medications on file.   No current facility-administered medications for this visit.   No results found.  Review of Systems:   A ROS was performed including pertinent positives and negatives as documented in the HPI.  Physical Exam :   Constitutional: NAD and appears stated age Neurological: Alert and oriented Psych: Appropriate affect and cooperative There were no vitals taken for this visit.   Comprehensive Musculoskeletal Exam:    Tenderness to palpation throughout the thoracic spine over the paraspinal muscles without midline tenderness.  Tenderness in the lumbar spine over the lower midline, SI joints, and paraspinal muscles.  Full passive ROM of bilateral hips  with flexion, ER, and IR.  Negative FABER bilaterally.  Straight leg raise in the left leg does exacerbate paresthesias in the foot.  Knee flexion/extension and ankle dorsiflexion/plantarflexion strength is 5/5.  Patellar reflexes 2+ and equal bilaterally.  Imaging:   Xray (thoracic spine 2 views, lumbar spine 4 views): Normal alignment of the vertebral bodies throughout without evidence of acute bony abnormality.     I personally reviewed and interpreted the radiographs.   Assessment:   33 y.o. male with acute thoracic and lumbar pain after a fall while snowboarding 3 days ago.  The pain in his thoracic region appears to be more muscular in nature which is understandable given his mechanism of injury.  No red flag symptoms are present  on exam today.  X-rays of both the thoracic and lumbar spine are reassuring today without any evidence of fracture or alignment changes.  I have recommended continuing with conservative management including NSAIDs, rest, and ice/heat.  Discussed today we will consider moving forward with an MRI however plan will be to monitor symptoms over the coming days.  Should pain or symptoms, particularly neurological, persist or worsen I would likely proceed with this.  Patient is in agreement with plan.  Plan :    - Return to clinic as needed - Consider MRI of the lumbar spine if neurologic symptoms persist or worsen     I personally saw and evaluated the patient, and participated in the management and treatment plan.  Leonce Reveal, PA-C Orthopedics

## 2023-05-25 ENCOUNTER — Encounter (HOSPITAL_BASED_OUTPATIENT_CLINIC_OR_DEPARTMENT_OTHER): Payer: Self-pay

## 2023-10-09 ENCOUNTER — Ambulatory Visit (HOSPITAL_BASED_OUTPATIENT_CLINIC_OR_DEPARTMENT_OTHER): Admitting: Student

## 2023-10-09 ENCOUNTER — Ambulatory Visit (HOSPITAL_BASED_OUTPATIENT_CLINIC_OR_DEPARTMENT_OTHER)

## 2023-10-09 ENCOUNTER — Encounter (HOSPITAL_BASED_OUTPATIENT_CLINIC_OR_DEPARTMENT_OTHER): Payer: Self-pay | Admitting: Student

## 2023-10-09 DIAGNOSIS — M79641 Pain in right hand: Secondary | ICD-10-CM | POA: Diagnosis not present

## 2023-10-09 NOTE — Progress Notes (Signed)
 Chief Complaint: Right hand injury    Discussed the use of AI scribe software for clinical note transcription with the patient, who gave verbal consent to proceed.  History of Present Illness Oscar Armstrong is a 33 year old male who presents with right hand pain and weakness following a previous fracture.  In 2016, he sustained a likely fracture to his right hand after punching the ground and chose immobilization with a gutter splint for about three weeks, with improvement after six to eight weeks. Did not have any imaging done at that time. He had no significant issues until recently.  Three to four weeks ago, he noticed difficulty maintaining a grip with his right hand while moving a chicken coop. He experienced an inability to abduct his pinky finger after a workout, initially attributing it to muscle fatigue. A week later, pain developed below the knuckle, worsening and spreading towards the carpal and metacarpal joint, accompanied by swelling. Swelling was more pronounced last 2023/10/29 but has since decreased slightly. He has been taping the affected area for two weeks, which provides some relief in the morning, but pain worsens throughout the day with use. Pain is primarily at the base of the metacarpal and is tender to touch. He experiences tingling sensations, but no numbness or tingling down the fingers. 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 He is right-handed.  Surgical History:   None  PMH/PSH/Family History/Social History/Meds/Allergies:    Past Medical History:  Diagnosis Date   Dyshidrotic eczema    Past Surgical History:  Procedure Laterality Date   VASECTOMY  05/15/2021   WISDOM TOOTH EXTRACTION     Social History   Socioeconomic History   Marital status: Married    Spouse name: Not on file   Number of children: Not on file    Years of education: Not on file   Highest education level: Not on file  Occupational History   Not on file  Tobacco Use   Smoking status: Never   Smokeless tobacco: Never  Substance and Sexual Activity   Alcohol use: No    Alcohol/week: 0.0 standard drinks of alcohol   Drug use: No   Sexual activity: Not on file  Other Topics Concern   Not on file  Social History Narrative   Married.   1 son.   Works as a Adult nurse.   Enjoys Advertising copywriter games, spending time with family, church.       Social Drivers of Corporate investment banker Strain: Not on file  Food Insecurity: Not on file  Transportation Needs: Not on file  Physical Activity: Not on file  Stress: Not on file  Social Connections: Not on file   Family History  Problem Relation Age of Onset   Arthritis Mother    Hyperlipidemia Mother    Hypertension Mother    Hyperlipidemia Maternal Aunt    Hyperlipidemia Maternal Grandmother    Kennedy's disease Maternal Grandfather    No Known Allergies No current outpatient medications on file.   No current facility-administered medications for this visit.   DG Hand Complete Right Result Date: 10/09/2023 CLINICAL DATA:  pain EXAM: RIGHT HAND - COMPLETE 3+ VIEW COMPARISON:  None Available. FINDINGS: Remote,  posttraumatic deformity of the fifth metacarpal.No acute fracture or dislocation. There is no evidence of arthropathy or other focal bone abnormality. Soft tissues are unremarkable. IMPRESSION: No acute fracture or dislocation. Electronically Signed   By: Rogelia Myers M.D.   On: 10/09/2023 14:42    Review of Systems:   A ROS was performed including pertinent positives and negatives as documented in the HPI.  Physical Exam :   Constitutional: NAD and appears stated age Neurological: Alert and oriented Psych: Appropriate affect and cooperative There were no vitals taken for this visit.   Comprehensive Musculoskeletal Exam:    Exam of the right hand  demonstrates protrusion of the lateral fifth metacarpal with residual palpable deformity at the metacarpal neck.  Tenderness along the fifth metacarpal and at the fifth CMC joint.  Patient able to form a full fist without rotational deformity.  Grip strength well-maintained.  Imaging:   Xray (right hand 3 views): Prior fifth metacarpal neck fracture resulting in a healed posttraumatic deformity.  Otherwise negative for bony abnormality.   I personally reviewed and interpreted the radiographs.      Assessment & Plan Right hand pain and swelling   Intermittent pain and swelling at the base of the metacarpal likely related to a previous fifth metacarpal fracture with recent overuse. X-rays show no new fracture or significant arthritis.  Consider a gutter brace for support during activities.  Continue current activities as tolerated and if symptoms continue, would likely refer to Dr. Agarwala for further evaluation and management.     I personally saw and evaluated the patient, and participated in the management and treatment plan.  Leonce Reveal, PA-C Orthopedics

## 2023-10-23 ENCOUNTER — Ambulatory Visit (INDEPENDENT_AMBULATORY_CARE_PROVIDER_SITE_OTHER): Payer: 59 | Admitting: Primary Care

## 2023-10-23 ENCOUNTER — Encounter: Payer: Self-pay | Admitting: Primary Care

## 2023-10-23 VITALS — BP 136/72 | HR 66 | Temp 97.4°F | Ht 68.5 in | Wt 164.0 lb

## 2023-10-23 DIAGNOSIS — L301 Dyshidrosis [pompholyx]: Secondary | ICD-10-CM | POA: Diagnosis not present

## 2023-10-23 DIAGNOSIS — Z0000 Encounter for general adult medical examination without abnormal findings: Secondary | ICD-10-CM

## 2023-10-23 DIAGNOSIS — R7989 Other specified abnormal findings of blood chemistry: Secondary | ICD-10-CM | POA: Diagnosis not present

## 2023-10-23 NOTE — Patient Instructions (Signed)
 Stop by the lab prior to leaving today. I will notify you of your results once received.   It was a pleasure to see you today!

## 2023-10-23 NOTE — Assessment & Plan Note (Signed)
Immunizations UTD.  Discussed the importance of a healthy diet and regular exercise in order for weight loss, and to reduce the risk of further co-morbidity.  Exam stable. Labs pending.  Follow up in 1 year for repeat physical.  

## 2023-10-23 NOTE — Progress Notes (Signed)
 Subjective:    Patient ID: Oscar Armstrong, male    DOB: 06/24/1990, 33 y.o.   MRN: [id number]  HPI  Oscar Armstrong is a very pleasant 33 y.o. male who presents today for complete physical and follow up of chronic conditions.  Immunizations: -Tetanus: Completed in 2017  Diet: Fair diet.  Exercise: Regular exercise.  Eye exam: Completes annually  Dental exam: Completes semi-annually     BP Readings from Last 3 Encounters:  10/23/23 136/72  10/21/22 116/66  10/08/21 118/72      Review of Systems  Constitutional:  Negative for unexpected weight change.  HENT:  Negative for rhinorrhea.   Respiratory:  Negative for cough and shortness of breath.   Cardiovascular:  Negative for chest pain.  Gastrointestinal:  Negative for constipation and diarrhea.  Genitourinary:  Negative for difficulty urinating.  Musculoskeletal:  Positive for back pain.  Skin:  Negative for rash.  Allergic/Immunologic: Negative for environmental allergies.  Neurological:  Positive for numbness. Negative for dizziness and headaches.  Psychiatric/Behavioral:  The patient is not nervous/anxious.          Past Medical History:  Diagnosis Date   Dyshidrotic eczema     Social History   Socioeconomic History   Marital status: Married    Spouse name: Not on file   Number of children: Not on file   Years of education: Not on file   Highest education level: Not on file  Occupational History   Not on file  Tobacco Use   Smoking status: Never   Smokeless tobacco: Never  Substance and Sexual Activity   Alcohol use: No    Alcohol/week: 0.0 standard drinks of alcohol   Drug use: No   Sexual activity: Not on file  Other Topics Concern   Not on file  Social History Narrative   Married.   1 son.   Works as a Adult nurse.   Enjoys Advertising copywriter games, spending time with family, church.       Social Drivers of Corporate investment banker Strain: Not on file  Food Insecurity: Not on  file  Transportation Needs: Not on file  Physical Activity: Not on file  Stress: Not on file  Social Connections: Not on file  Intimate Partner Violence: Not on file    Past Surgical History:  Procedure Laterality Date   VASECTOMY  05/15/2021   WISDOM TOOTH EXTRACTION      Family History  Problem Relation Age of Onset   Arthritis Mother    Hyperlipidemia Mother    Hypertension Mother    Hyperlipidemia Maternal Aunt    Hyperlipidemia Maternal Grandmother    Kennedy's disease Maternal Grandfather     No Known Allergies  No current outpatient medications on file prior to visit.   No current facility-administered medications on file prior to visit.    BP 136/72   Pulse 66   Temp (!) 97.4 F (36.3 C) (Temporal)   Ht 5' 8.5 (1.74 m)   Wt 164 lb (74.4 kg)   SpO2 97%   BMI 24.57 kg/m  Objective:   Physical Exam HENT:     Right Ear: Tympanic membrane and ear canal normal.     Left Ear: Tympanic membrane and ear canal normal.  Eyes:     Pupils: Pupils are equal, round, and reactive to light.  Cardiovascular:     Rate and Rhythm: Normal rate and regular rhythm.  Pulmonary:     Effort: Pulmonary effort is normal.  Breath sounds: Normal breath sounds.  Abdominal:     General: Bowel sounds are normal.     Palpations: Abdomen is soft.     Tenderness: There is no abdominal tenderness.  Musculoskeletal:        General: Normal range of motion.     Cervical back: Neck supple.  Skin:    General: Skin is warm and dry.  Neurological:     Mental Status: He is alert and oriented to person, place, and time.     Cranial Nerves: No cranial nerve deficit.     Deep Tendon Reflexes:     Reflex Scores:      Patellar reflexes are 2+ on the right side and 2+ on the left side. Psychiatric:        Mood and Affect: Mood normal.           Assessment & Plan:  Preventative health care Assessment & Plan: Immunizations UTD.  Discussed the importance of a healthy diet and  regular exercise in order for weight loss, and to reduce the risk of further co-morbidity.  Exam stable. Labs pending.  Follow up in 1 year for repeat physical.   Orders: -     CBC -     Comprehensive metabolic panel with GFR -     Lipid panel  Dyshidrotic eczema Assessment & Plan: Controlled.  Following with dermatology.   Elevated LFTs Assessment & Plan: Improved in 2024 compared to 2023.  Repeat LFTs pending.  Orders: -     CBC -     Comprehensive metabolic panel with GFR -     Lipid panel        Comer MARLA Gaskins, NP

## 2023-10-23 NOTE — Addendum Note (Signed)
 Addended by: ISADORA RAISIN on: 10/23/2023 02:17 PM   Modules accepted: Orders

## 2023-10-23 NOTE — Assessment & Plan Note (Signed)
 Controlled.  Following with dermatology.

## 2023-10-23 NOTE — Assessment & Plan Note (Signed)
 Improved in 2024 compared to 2023.  Repeat LFTs pending.

## 2023-10-24 LAB — CBC
HCT: 44.1 % (ref 38.5–50.0)
Hemoglobin: 14.8 g/dL (ref 13.2–17.1)
MCH: 29.7 pg (ref 27.0–33.0)
MCHC: 33.6 g/dL (ref 32.0–36.0)
MCV: 88.6 fL (ref 80.0–100.0)
MPV: 11.8 fL (ref 7.5–12.5)
Platelets: 235 Thousand/uL (ref 140–400)
RBC: 4.98 Million/uL (ref 4.20–5.80)
RDW: 12.6 % (ref 11.0–15.0)
WBC: 4.4 Thousand/uL (ref 3.8–10.8)

## 2023-10-24 LAB — COMPREHENSIVE METABOLIC PANEL WITH GFR
AG Ratio: 1.9 (calc) (ref 1.0–2.5)
ALT: 51 U/L — ABNORMAL HIGH (ref 9–46)
AST: 50 U/L — ABNORMAL HIGH (ref 10–40)
Albumin: 4.7 g/dL (ref 3.6–5.1)
Alkaline phosphatase (APISO): 43 U/L (ref 36–130)
BUN: 15 mg/dL (ref 7–25)
CO2: 28 mmol/L (ref 20–32)
Calcium: 9.8 mg/dL (ref 8.6–10.3)
Chloride: 104 mmol/L (ref 98–110)
Creat: 1.05 mg/dL (ref 0.60–1.26)
Globulin: 2.5 g/dL (ref 1.9–3.7)
Glucose, Bld: 77 mg/dL (ref 65–99)
Potassium: 4.1 mmol/L (ref 3.5–5.3)
Sodium: 143 mmol/L (ref 135–146)
Total Bilirubin: 0.4 mg/dL (ref 0.2–1.2)
Total Protein: 7.2 g/dL (ref 6.1–8.1)
eGFR: 96 mL/min/1.73m2 (ref >=60)

## 2023-10-24 LAB — LIPID PANEL
Cholesterol: 185 mg/dL (ref <200)
HDL: 60 mg/dL (ref >=40)
LDL Cholesterol (Calc): 97 mg/dL
Non-HDL Cholesterol (Calc): 125 mg/dL (ref <130)
Total CHOL/HDL Ratio: 3.1 (calc) (ref <5.0)
Triglycerides: 190 mg/dL — ABNORMAL HIGH (ref <150)

## 2023-10-25 ENCOUNTER — Ambulatory Visit: Payer: Self-pay | Admitting: Primary Care

## 2023-10-25 DIAGNOSIS — R7989 Other specified abnormal findings of blood chemistry: Secondary | ICD-10-CM

## 2024-02-15 ENCOUNTER — Encounter: Payer: Self-pay | Admitting: Radiology

## 2024-10-28 ENCOUNTER — Encounter: Admitting: Primary Care
